# Patient Record
Sex: Female | Born: 1960
Health system: Southern US, Community
[De-identification: ages and names within clinical notes are randomized; demographics above are authoritative.]

## PROBLEM LIST (undated history)

## (undated) DIAGNOSIS — F419 Anxiety disorder, unspecified: Secondary | ICD-10-CM

## (undated) DIAGNOSIS — E785 Hyperlipidemia, unspecified: Secondary | ICD-10-CM

## (undated) DIAGNOSIS — K219 Gastro-esophageal reflux disease without esophagitis: Secondary | ICD-10-CM

## (undated) DIAGNOSIS — T7840XA Allergy, unspecified, initial encounter: Secondary | ICD-10-CM

## (undated) DIAGNOSIS — M1990 Unspecified osteoarthritis, unspecified site: Secondary | ICD-10-CM

## (undated) DIAGNOSIS — Z8619 Personal history of other infectious and parasitic diseases: Secondary | ICD-10-CM

## (undated) HISTORY — DX: Personal history of other infectious and parasitic diseases: Z86.19

## (undated) HISTORY — DX: Allergy, unspecified, initial encounter: T78.40XA

## (undated) HISTORY — DX: Anxiety disorder, unspecified: F41.9

## (undated) HISTORY — DX: Gastro-esophageal reflux disease without esophagitis: K21.9

## (undated) HISTORY — DX: Unspecified osteoarthritis, unspecified site: M19.90

## (undated) HISTORY — DX: Hyperlipidemia, unspecified: E78.5

---

## 2004-08-24 ENCOUNTER — Ambulatory Visit: Payer: Self-pay | Admitting: Unknown Physician Specialty

## 2011-02-13 ENCOUNTER — Ambulatory Visit: Payer: Self-pay | Admitting: Unknown Physician Specialty

## 2011-03-14 ENCOUNTER — Ambulatory Visit: Payer: Self-pay | Admitting: Unknown Physician Specialty

## 2011-07-09 LAB — HM MAMMOGRAPHY: HM Mammogram: NORMAL

## 2011-07-09 LAB — HM PAP SMEAR: HM Pap smear: NORMAL

## 2014-02-15 ENCOUNTER — Telehealth: Payer: Self-pay | Admitting: Internal Medicine

## 2014-02-15 NOTE — Telephone Encounter (Signed)
Staff msg sent to Dr. Derrel Nip for confirmation accepting as new pt.

## 2014-06-16 LAB — BASIC METABOLIC PANEL
CREATININE: 0.9 mg/dL (ref 0.5–1.1)
GLUCOSE: 82 mg/dL

## 2014-06-16 LAB — HEMOGLOBIN A1C: Hgb A1c MFr Bld: 5.8 % (ref 4.0–6.0)

## 2014-06-16 LAB — LIPID PANEL
CHOLESTEROL: 209 mg/dL — AB (ref 0–200)
HDL: 62 mg/dL (ref 35–70)
LDL Cholesterol: 126 mg/dL
TRIGLYCERIDES: 104 mg/dL (ref 40–160)

## 2014-07-08 ENCOUNTER — Ambulatory Visit (INDEPENDENT_AMBULATORY_CARE_PROVIDER_SITE_OTHER): Payer: 59 | Admitting: Internal Medicine

## 2014-07-08 ENCOUNTER — Encounter: Payer: Self-pay | Admitting: Internal Medicine

## 2014-07-08 VITALS — BP 112/70 | HR 66 | Temp 98.5°F | Ht 62.75 in | Wt 182.5 lb

## 2014-07-08 DIAGNOSIS — N6002 Solitary cyst of left breast: Secondary | ICD-10-CM | POA: Insufficient documentation

## 2014-07-08 DIAGNOSIS — N951 Menopausal and female climacteric states: Secondary | ICD-10-CM

## 2014-07-08 DIAGNOSIS — M542 Cervicalgia: Secondary | ICD-10-CM

## 2014-07-08 DIAGNOSIS — E559 Vitamin D deficiency, unspecified: Secondary | ICD-10-CM

## 2014-07-08 DIAGNOSIS — N8111 Cystocele, midline: Secondary | ICD-10-CM

## 2014-07-08 DIAGNOSIS — Z1211 Encounter for screening for malignant neoplasm of colon: Secondary | ICD-10-CM | POA: Insufficient documentation

## 2014-07-08 DIAGNOSIS — IMO0002 Reserved for concepts with insufficient information to code with codable children: Secondary | ICD-10-CM

## 2014-07-08 DIAGNOSIS — E663 Overweight: Secondary | ICD-10-CM

## 2014-07-08 DIAGNOSIS — E669 Obesity, unspecified: Secondary | ICD-10-CM | POA: Insufficient documentation

## 2014-07-08 DIAGNOSIS — Z1239 Encounter for other screening for malignant neoplasm of breast: Secondary | ICD-10-CM

## 2014-07-08 LAB — HEPATIC FUNCTION PANEL
ALK PHOS: 80 U/L (ref 25–125)
ALT: 72 U/L — AB (ref 7–35)
AST: 42 U/L — AB (ref 13–35)
Bilirubin, Total: 0.3 mg/dL

## 2014-07-08 LAB — CBC AND DIFFERENTIAL
HCT: 42 % (ref 36–46)
HEMOGLOBIN: 13.9 g/dL (ref 12.0–16.0)
Neutrophils Absolute: 3 /uL
PLATELETS: 315 10*3/uL (ref 150–399)
WBC: 5.6 10^3/mL

## 2014-07-08 LAB — LIPID PANEL
CHOLESTEROL: 236 mg/dL — AB (ref 0–200)
HDL: 77 mg/dL — AB (ref 35–70)
LDL Cholesterol: 146 mg/dL
TRIGLYCERIDES: 65 mg/dL (ref 40–160)

## 2014-07-08 LAB — BASIC METABOLIC PANEL
BUN: 12 mg/dL (ref 4–21)
Creatinine: 0.9 mg/dL (ref 0.5–1.1)
Glucose: 90 mg/dL
Potassium: 4.3 mmol/L (ref 3.4–5.3)
SODIUM: 140 mmol/L (ref 137–147)

## 2014-07-08 LAB — HEMOGLOBIN A1C: Hgb A1c MFr Bld: 5.8 % (ref 4.0–6.0)

## 2014-07-08 LAB — TSH: TSH: 4.46 u[IU]/mL (ref 0.41–5.90)

## 2014-07-08 NOTE — Assessment & Plan Note (Addendum)
I have addressed  BMI and recommended a low glycemic index diet utilizing smaller more frequent meals to increase metabolism.  I have also recommended that patient increased her exercise routine to 5 days per week of 30 minutes of aerobic exercise . Screening for lipid disorders, thyroid and diabetes to be done today.   Her Goal is 165 lbs to lower BMI  To < 30, and 140 for BMI 25

## 2014-07-08 NOTE — Patient Instructions (Signed)
This is  my version of a  "Low GI"  Diet:  It will still lower your blood sugars and allow you to lose 4 to 8  lbs  per month if you follow it carefully.  Your goal with exercise is a minimum of 30 minutes of aerobic exercise 5 days per week (Walking does not count once it becomes easy!)      All of the foods can be found at grocery stores and in bulk at [employer].  The Atkins protein bars and shakes are available in more varieties at Target, [employer] and [place].     7 AM Breakfast:  Choose from the following:  Low carbohydrate Protein  Shakes (I recommend only the EAS AdvantEdge "Carb Control" shakes  Or the low carb shakes by Atkins.    2.5 carbs)   Arnold's "Sandwhich Thin"toasted  w/ peanut butter (no jelly: about 20 net carbs  "Bagel Thin" with cream cheese and salmon: about 20 carbs   a scrambled egg/bacon/cheese burrito made with Mission's "carb balance" whole wheat tortilla  (about 10 net carbs )  A slice of home made fritatta (egg based dish without a crust:  google it)    Avoid cereal and bananas, oatmeal and cream of wheat and grits. They are loaded with carbohydrates!   10 AM: high protein snack  Protein bar by Atkins (the snack size, under 200 cal, usually < 6 net carbs).    A stick of cheese:  Around 1 carb,  100 cal     Dannon Light n Fit [place] Yogurt  (80 cal, 8 carbs)  Other so called "protein bars" and Greek yogurts tend to be loaded with carbohydrates.  Remember, in food advertising, the word "energy" is synonymous for " carbohydrate."  Lunch:   A Sandwich using the bread choices listed, Can use any  Eggs,  lunchmeat, grilled meat or canned tuna), avocado, regular mayo/mustard  and cheese.  A Salad using blue cheese, ranch,  Goddess or vinagrette,  No croutons or "confetti" and no "candied nuts" but regular nuts OK.   No pretzels or chips.  Pickles and miniature sweet peppers are a good low carb alternative that provide a "crunch"  The bread is the only source  of carbohydrate in a sandwich and  can be decreased by trying some of these alternatives to traditional loaf bread  Joseph's makes a pita bread and a flat bread that are 50 cal and 4 net carbs available at [place].  This can be toasted to use with hummous as well  Toufayan makes a low carb flatbread that's 100 cal and 9 net carbs available at [employer] and [employer]J's makes 2 sizes of  Low carb whole wheat tortilla  (The large one is 210 cal and 6 net carbs) Avoid "Low fat dressings, as well as Barry Brunner and Morganza dressings They are loaded with sugar!   3 PM/ Mid day  Snack:  Consider  1 ounce of  almonds, walnuts, pistachios, pecans, peanuts,  Macadamia nuts or a nut medley.  Avoid "granola"; the dried cranberries and raisins are loaded with carbohydrates. Mixed nuts as long as there are no raisins,  cranberries or dried fruit.    Try the prosciutto/mozzarella cheese sticks by Fiorruci  In deli /backery section   High protein   To avoid overindulging in snacks: Try drinking a glass of unsweeted almond/coconut milk  Or a cup of coffee with your Atkins chocolate  bar t o keep you from having 3!!!        6 PM  Dinner:     Meat/fowl/fish with a green salad, and either broccoli, cauliflower, green beans, spinach, brussel sprouts or  Lima beans. DO NOT BREAD THE PROTEIN!!      There is a low carb pasta by Dreamfield's that is acceptable and tastes great: only 5 digestible carbs/serving.( All grocery stores but BJs carry it )  Try Hurley Cisco Angelo's chicken piccata or chicken or eggplant parm over low carb pasta.(Lowes and BJs)   Marjory Lies Sanchez's "Carnitas" (pulled pork, no sauce,  0 carbs) or his beef pot roast to make a dinner burrito (at BJ's)  Pesto over low carb pasta (bj's sells a good quality pesto in the center refrigerated section of the deli   Try satueeing  Cheral Marker with mushroooms  Whole wheat pasta is still full of digestible carbs and  Not as low in glycemic index as  Dreamfield's.   Brown rice is still rice,  So skip the rice and noodles if you eat [place] or [place] (or at least limit to 1/2 cup)  9 PM snack :   Breyer's "low carb" fudgsicle or  ice cream bar (Carb Smart line), or  Weight Watcher's ice cream bar , or another "no sugar added" ice cream;  a serving of fresh berries/cherries with whipped cream   Cheese or DANNON'S LlGHT N FIT GREEK YOGURT or the Oikos greek yogurt   8 ounces of [place] unsweetened almond/cococunut milk    Avoid bananas, pineapple, grapes  and watermelon on a regular basis because they are high in sugar.  THINK OF THEM AS DESSERT  Remember that snack Substitutions should be less than 10 NET carbs per serving and meals < 20 carbs. Remember to subtract fiber grams to get the "net carbs."

## 2014-07-08 NOTE — Progress Notes (Signed)
Patient ID: Marie Howard, female   DOB: 1961-11-04, 53 y.o.   MRN: [id number]  Patient Active Problem List   Diagnosis Date Noted  . Unspecified vitamin D deficiency 07/08/2014  . Special screening for malignant neoplasms, colon 07/08/2014  . Obesity 07/08/2014  . Screening for breast cancer 07/08/2014    Subjective:  CC:   Chief Complaint  Patient presents with  . Establish Care    HPI:   Marie Howard a 53 y.o. female who presents as a New patient who unfortuantely arrived 15 min late,  And then proceeed to take 20 minutes to check in   Cc: recurrent tenderness of node under right ear for the past month,  Intermittent , seems to improve with massage of area.  No history of ear or sinus infections or scalp infections.   2) Has occasional vaginal discomfort  Sometimes feels a bulge . Had 2 vaginal deliveries,  Menopause at age 78  .  Sex life is nil,  Marriage is good . Some discomfort during sex. Has vaginal irritation.  Feels like a scrape. Might have seen blood when she wiped. Last UTI was over a year ago  Had an endometrial biopsy in 2006 which was normal, PAP smears have been normal since gthen.   3) Obesity;  Has had difficulty losing weigh.  Exercises regularly at local gym. tNeeds to appeal  [facility]'s  obesity tax with a letter from me   Past Medical History  Diagnosis Date  . Arthritis   . GERD (gastroesophageal reflux disease)   . Hyperlipidemia   . History of chicken pox     Allergies  Allergen Reactions  . Macrobid [Nitrofurantoin Monohyd Macro] Swelling   History reviewed. No pertinent past surgical history.  History   Social History  . Marital Status: Married    Spouse Name: N/A    Number of Children: N/A  . Years of Education: N/A   Occupational History  . Not on file.   Social History Main Topics  . Smoking status: Never Smoker   . Smokeless tobacco: Not on file  . Alcohol Use: 2.4 oz/week    4 Glasses of wine per week  . Drug Use: No  .  Sexual Activity: Yes   Other Topics Concern  . Not on file   Social History Narrative  . No narrative on file   Family History  Problem Relation Age of Onset  . Arthritis Mother   . Hyperlipidemia Mother   . Hypertension Mother   . Arthritis Father   . Hyperlipidemia Father   . Heart disease Father 13    CAD s/p 2 vessel CABG   . Hypertension Father   . Heart disease Paternal Grandmother   . Hyperlipidemia Paternal Grandmother   . Stroke Paternal Grandmother   . Cancer Paternal Grandfather     colon   Review of Systems:   The rest of the review of systems was negative except those addressed in the HPI.      Objective:  BP 112/70  Pulse 66  Temp(Src) 98.5 F (36.9 C) (Oral)  Ht 5' 2.75" (1.594 m)  Wt 182 lb 8 oz (82.781 kg)  BMI 32.58 kg/m2  SpO2 98%  General appearance: alert, cooperative and appears stated age Ears: normal TM's and external ear canals both ears Throat: lips, mucosa, and tongue normal; teeth and gums normal Neck: no adenopathy, no carotid bruit, supple, symmetrical, trachea midline and thyroid not enlarged, symmetric, no tenderness/mass/nodules Back: symmetric, no curvature.  ROM normal. No CVA tenderness. Lungs: clear to auscultation bilaterally Heart: regular rate and rhythm, S1, S2 normal, no murmur, click, rub or gallop Abdomen: soft, non-tender; bowel sounds normal; no masses,  no organomegaly Pulses: 2+ and symmetric Skin: Skin color, texture, turgor normal. No rashes or lesions Lymph nodes: Cervical, supraclavicular, and axillary nodes normal.  Assessment and Plan:  Obesity I have addressed  BMI and recommended a low glycemic index diet utilizing smaller more frequent meals to increase metabolism.  I have also recommended that patient increased her exercise routine to 5 days per week of 30 minutes of aerobic exercise . Screening for lipid disorders, thyroid and diabetes to be done today.   Her Goal is 165 lbs to lower BMI  To < 30,  and 140 for BMI 25   Special screening for malignant neoplasms, colon Recommended colonoscopy given FH of colon CA   Screening for breast cancer She is due for mammogram.  Unspecified vitamin D deficiency Rechecking level.  Discussed the current controversies surrounding the risks and benefits of calcium supplementation.  Encouraged her to increase dietary calcium through natural foods including almond/coconut milk  Neck pain on right side No evidence of LAD on exam today and HEENT exam was normal.  Suggested that her right sided neck pain was from  SCM muscle sprain ,. Will repeat eval in one month at annual exam.   Cystocele Suspected by histoyr.  Will return for pelvic exam in one  month  Vaginal dryness, menopausal Suspected by history .  Will return for pelvic exam in one month   A total of 60 minutes was spent with patient more than half of which was spent in counseling patient on the above mentioned issues , reviewing and explaining recent labs and imaging studies done, and coordination of care.   Updated Medication List Outpatient Encounter Prescriptions as of 07/08/2014  Medication Sig  . DHA-EPA-Vitamin E (OMEGA-3 COMPLEX PO) Take 2 capsules by mouth daily.  Marland Kitchen OVER THE COUNTER MEDICATION 2 capsules daily. Micro plex food nutrient  . OVER THE COUNTER MEDICATION 1 capsule daily. Bone nutrient  . OVER THE COUNTER MEDICATION 2 capsules daily. Cellular vitality complex

## 2014-07-08 NOTE — Progress Notes (Signed)
Pre visit review using our clinic review tool, if applicable. No additional management support is needed unless otherwise documented below in the visit note. 

## 2014-07-10 ENCOUNTER — Encounter: Payer: Self-pay | Admitting: Internal Medicine

## 2014-07-10 DIAGNOSIS — N951 Menopausal and female climacteric states: Secondary | ICD-10-CM | POA: Insufficient documentation

## 2014-07-10 DIAGNOSIS — IMO0002 Reserved for concepts with insufficient information to code with codable children: Secondary | ICD-10-CM | POA: Insufficient documentation

## 2014-07-10 DIAGNOSIS — M542 Cervicalgia: Secondary | ICD-10-CM | POA: Insufficient documentation

## 2014-07-10 NOTE — Assessment & Plan Note (Signed)
Suspected by history .  Will return for pelvic exam in one month

## 2014-07-10 NOTE — Assessment & Plan Note (Signed)
Suspected by histoyr.  Will return for pelvic exam in one  month

## 2014-07-10 NOTE — Assessment & Plan Note (Signed)
Recommended colonoscopy given FH of colon CA

## 2014-07-10 NOTE — Assessment & Plan Note (Signed)
She is due for mammogram.

## 2014-07-10 NOTE — Assessment & Plan Note (Signed)
No evidence of LAD on exam today and HEENT exam was normal.  Suggested that her right sided neck pain was from  SCM muscle sprain ,. Will repeat eval in one month at annual exam.

## 2014-07-10 NOTE — Assessment & Plan Note (Signed)
Rechecking level.  Discussed the current controversies surrounding the risks and benefits of calcium supplementation.  Encouraged her to increase dietary calcium through natural foods including almond/coconut milk

## 2014-07-11 ENCOUNTER — Telehealth: Payer: Self-pay | Admitting: Internal Medicine

## 2014-07-15 ENCOUNTER — Telehealth: Payer: Self-pay | Admitting: Internal Medicine

## 2014-07-15 NOTE — Telephone Encounter (Signed)
Your vitamin D is low, which can increase your risk of weak bones and fractures and interfere with your body's ability to absorb the calcium in your diet.   I am calling in a megadose of Vit D to take once weekly for a total of 3 months,  Then after you finish the weekly supplement, you should start taking an OTC  Vit D3 supplement 1000 units daily.    Your liver enzyme is a little elevated, which can be a sign of inflammation of the liver due to various reasons (which include viral hepatitis,  Recent strenous workout. Statin intolerance,  autoimmune hepatitis and fatty liver ).  I would like you to return in a month for a lab draw to run the liver test again along with some additional tests to rule out the other causes.   You do not need to be fasting, but should call the office for a lab appt.   Your cholesterol is borderline ,  You do not need medications.  We will repeat in 6 month s

## 2014-07-16 ENCOUNTER — Telehealth: Payer: Self-pay | Admitting: Internal Medicine

## 2014-07-16 DIAGNOSIS — R748 Abnormal levels of other serum enzymes: Secondary | ICD-10-CM

## 2014-07-16 MED ORDER — ERGOCALCIFEROL 1.25 MG (50000 UT) PO CAPS: 50000.0000 [IU] | ORAL_CAPSULE | ORAL | Status: DC

## 2014-07-16 NOTE — Telephone Encounter (Signed)
Called and advised patient of results, verbalized understanding. Pt has labs done at [place], would like to pick up a lab form and her "appeals paperwork" at the same time. Would like a call when those are ready. Advised pt I would check with Juliann Pulse on the status of that.   Vitamin D Rx needs sent to the pharmacy, please?

## 2014-07-16 NOTE — Telephone Encounter (Signed)
Patient stated she gave form to front desk explained to patient that should have been given to me, patient stated was fine she will fax another copy on Monday. Please send back so I can follow up Monday. FYI

## 2014-07-16 NOTE — Telephone Encounter (Signed)
And please ask front desk to designate me as PCP ,  She is not coming up as my patient so I have to find her date of brith every time I want to pull her up thanks

## 2014-07-16 NOTE — Telephone Encounter (Signed)
Did she drop off the appeals letter? I do not have it .  Vit d sent

## 2014-07-22 ENCOUNTER — Encounter: Payer: Self-pay | Admitting: Internal Medicine

## 2014-08-16 LAB — HEPATIC FUNCTION PANEL
ALT: 31 U/L (ref 7–35)
AST: 24 U/L (ref 13–35)
BILIRUBIN DIRECT: 0.1 mg/dL (ref 0.01–0.4)
BILIRUBIN, TOTAL: 0.4 mg/dL

## 2014-08-17 ENCOUNTER — Telehealth: Payer: Self-pay | Admitting: Internal Medicine

## 2014-08-20 ENCOUNTER — Telehealth: Payer: Self-pay | Admitting: Internal Medicine

## 2014-08-20 DIAGNOSIS — R748 Abnormal levels of other serum enzymes: Secondary | ICD-10-CM

## 2014-08-20 DIAGNOSIS — K760 Fatty (change of) liver, not elsewhere classified: Secondary | ICD-10-CM | POA: Insufficient documentation

## 2014-08-20 NOTE — Telephone Encounter (Signed)
Sent mychart message with results

## 2014-08-20 NOTE — Assessment & Plan Note (Signed)
Repeat hepatic panel and screen for autoimmune forms of liver diease was negative. No further workup is indicated  at this time

## 2014-08-20 NOTE — Telephone Encounter (Signed)
Repeat hepatic panel and screen for autoimmune forms of liver diease was negative. No further workup is indicated  at this time

## 2014-08-23 ENCOUNTER — Encounter: Payer: Self-pay | Admitting: Internal Medicine

## 2014-08-23 ENCOUNTER — Other Ambulatory Visit: Payer: Self-pay | Admitting: Internal Medicine

## 2014-08-23 ENCOUNTER — Ambulatory Visit (INDEPENDENT_AMBULATORY_CARE_PROVIDER_SITE_OTHER): Payer: 59 | Admitting: Internal Medicine

## 2014-08-23 VITALS — BP 118/80 | HR 76 | Temp 97.9°F | Resp 16 | Ht 63.0 in | Wt 182.2 lb

## 2014-08-23 DIAGNOSIS — Z1211 Encounter for screening for malignant neoplasm of colon: Secondary | ICD-10-CM

## 2014-08-23 DIAGNOSIS — N951 Menopausal and female climacteric states: Secondary | ICD-10-CM

## 2014-08-23 DIAGNOSIS — Z23 Encounter for immunization: Secondary | ICD-10-CM

## 2014-08-23 DIAGNOSIS — Z0000 Encounter for general adult medical examination without abnormal findings: Secondary | ICD-10-CM

## 2014-08-23 DIAGNOSIS — E669 Obesity, unspecified: Secondary | ICD-10-CM

## 2014-08-23 DIAGNOSIS — R748 Abnormal levels of other serum enzymes: Secondary | ICD-10-CM

## 2014-08-23 NOTE — Assessment & Plan Note (Signed)
I have addressed  BMI and recommended a low glycemic index diet utilizing smaller more frequent meals to increase metabolism.  I have also recommended that patient increased her exercise routine to 5 days per week of 30 minutes of aerobic exercise . Screening for lipid disorders, thyroid and diabetes to be done today.   Her Goal is 165 lbs to lower BMI  To < 30, and 140 for BMI 25

## 2014-08-23 NOTE — Assessment & Plan Note (Signed)
Likely  From muscle breakdown, sinc eit resolved with repeat testing and temporary suspension of weight lifting and screening for other causes was negative

## 2014-08-23 NOTE — Patient Instructions (Addendum)
You had your annual  wellness exam today.  We will repeat your PAP smear in 2018    You received the TDaP vaccine today.  If you develop a sore arm , use tylenol and ibuprofen for a few days    Your repeat liver enzymes were normal,  The elevation was likely due to  the muscle breakdown that occurs with weightlifting t and vigorous exercise.  No further workup is required

## 2014-08-23 NOTE — Progress Notes (Signed)
Pre-visit discussion using our clinic review tool. No additional management support is needed unless otherwise documented below in the visit note.  

## 2014-08-25 DIAGNOSIS — Z1211 Encounter for screening for malignant neoplasm of colon: Secondary | ICD-10-CM | POA: Insufficient documentation

## 2014-08-25 DIAGNOSIS — Z0000 Encounter for general adult medical examination without abnormal findings: Secondary | ICD-10-CM | POA: Insufficient documentation

## 2014-08-25 NOTE — Progress Notes (Signed)
Patient ID: Marie Howard, female   DOB: 1961/09/22, 53 y.o.   MRN: [id number]  Subjective:     Marie Howard is a 53 y.o. female and is here for a comprehensive physical exam. The patient reports no problems.  History   Social History  . Marital Status: Married    Spouse Name: N/A    Number of Children: N/A  . Years of Education: N/A   Occupational History  . Not on file.   Social History Main Topics  . Smoking status: Never Smoker   . Smokeless tobacco: Not on file  . Alcohol Use: 2.4 oz/week    4 Glasses of wine per week  . Drug Use: No  . Sexual Activity: Yes   Other Topics Concern  . Not on file   Social History Narrative  . No narrative on file   Health Maintenance  Topic Date Due  . Colonoscopy  01/20/2011  . Mammogram  07/08/2013  . Influenza Vaccine  06/19/2014  . Pap Smear  07/08/2014  . Tetanus/tdap  08/23/2024    The following portions of the patient's history were reviewed and updated as appropriate: allergies, current medications, past family history, past medical history, past social history, past surgical history and problem list.  Review of Systems A comprehensive review of systems was negative.   Objective:   BP 118/80  Pulse 76  Temp(Src) 97.9 F (36.6 C) (Oral)  Resp 16  Ht 5\' 3"  (1.6 m)  Wt 182 lb 4 oz (82.668 kg)  BMI 32.29 kg/m2  SpO2 98%  General Appearance:    Alert, cooperative, no distress, appears stated age  Head:    Normocephalic, without obvious abnormality, atraumatic  Eyes:    PERRL, conjunctiva/corneas clear, EOM's intact, fundi    benign, both eyes  Ears:    Normal TM's and external ear canals, both ears  Nose:   Nares normal, septum midline, mucosa normal, no drainage    or sinus tenderness  Throat:   Lips, mucosa, and tongue normal; teeth and gums normal  Neck:   Supple, symmetrical, trachea midline, no adenopathy;    thyroid:  no enlargement/tenderness/nodules; no carotid   bruit or JVD  Back:     Symmetric, no  curvature, ROM normal, no CVA tenderness  Lungs:     Clear to auscultation bilaterally, respirations unlabored  Chest Wall:    No tenderness or deformity   Heart:    Regular rate and rhythm, S1 and S2 normal, no murmur, rub   or gallop  Breast Exam:    No tenderness, masses, or nipple abnormality  Abdomen:     Soft, non-tender, bowel sounds active all four quadrants,    no masses, no organomegaly  Genitalia:    Pelvic: cervix normal in appearance, external genitalia normal, no adnexal masses or tenderness, no cervical motion tenderness, rectovaginal septum normal, uterus normal size, shape, and consistency and vagina normal without discharge  Extremities:   Extremities normal, atraumatic, no cyanosis or edema  Pulses:   2+ and symmetric all extremities  Skin:   Skin color, texture, turgor normal, no rashes or lesions  Lymph nodes:   Cervical, supraclavicular, and axillary nodes normal  Neurologic:   CNII-XII intact, normal strength, sensation and reflexes    throughout    Assessment and Plan:   Elevated liver enzymes Likely  From muscle breakdown, sinc eit resolved with repeat testing and temporary suspension of weight lifting and screening for other causes was negative   Obesity I  have addressed  BMI and recommended a low glycemic index diet utilizing smaller more frequent meals to increase metabolism.  I have also recommended that patient increased her exercise routine to 5 days per week of 30 minutes of aerobic exercise . Screening for lipid disorders, thyroid and diabetes to be done today.   Her Goal is 165 lbs to lower BMI  To < 30, and 140 for BMI 25     Vaginal dryness, menopausal Discussed  Available  hormonal and non hormonal options. She prefers to avoid estrogen at this time;   Visit for preventive health examination Annual comprehensive exam was done including breast,pelvic and PAP smear. All screenings have been addressed and a printed health maintenance schedule was  given to patient.     Updated Medication List Outpatient Encounter Prescriptions as of 08/23/2014  Medication Sig  . DHA-EPA-Vitamin E (OMEGA-3 COMPLEX PO) Take 2 capsules by mouth daily.  . ergocalciferol (DRISDOL) 50000 UNITS capsule Take 1 capsule (50,000 Units total) by mouth once a week.  Marland Kitchen OVER THE COUNTER MEDICATION 2 capsules daily. Micro plex food nutrient  . OVER THE COUNTER MEDICATION 1 capsule daily. Bone nutrient  . OVER THE COUNTER MEDICATION 2 capsules daily. Cellular vitality complex

## 2014-08-25 NOTE — Assessment & Plan Note (Addendum)
Discussed  Available  hormonal and non hormonal options. She prefers to avoid estrogen at this time;

## 2014-08-25 NOTE — Assessment & Plan Note (Signed)
Annual comprehensive exam was done including breast, pelvic and PAP smear. All screenings have been addressed and a printed health maintenance schedule was given to patient.   

## 2014-08-26 ENCOUNTER — Encounter: Payer: Self-pay | Admitting: Internal Medicine

## 2014-08-27 LAB — PAP LB, HPV-H+LR
HPV DNA HIGH RISK: NEGATIVE
HPV DNA LOW RISK: NEGATIVE
PAP Smear Comment: 0

## 2014-08-29 ENCOUNTER — Encounter: Payer: Self-pay | Admitting: Internal Medicine

## 2014-08-31 ENCOUNTER — Telehealth: Payer: Self-pay | Admitting: Internal Medicine

## 2014-09-06 ENCOUNTER — Encounter: Payer: Self-pay | Admitting: Internal Medicine

## 2014-09-13 ENCOUNTER — Ambulatory Visit: Payer: Self-pay | Admitting: Internal Medicine

## 2014-09-13 LAB — HM MAMMOGRAPHY: HM Mammogram: NEGATIVE

## 2014-09-13 NOTE — Telephone Encounter (Signed)
Mailed unread message to pt  

## 2014-09-15 ENCOUNTER — Encounter: Payer: Self-pay | Admitting: *Deleted

## 2014-09-30 ENCOUNTER — Encounter: Payer: Self-pay | Admitting: Internal Medicine

## 2016-03-21 ENCOUNTER — Telehealth: Payer: Self-pay | Admitting: Internal Medicine

## 2016-03-21 DIAGNOSIS — R748 Abnormal levels of other serum enzymes: Secondary | ICD-10-CM

## 2016-03-21 DIAGNOSIS — Z0000 Encounter for general adult medical examination without abnormal findings: Secondary | ICD-10-CM

## 2016-03-21 DIAGNOSIS — E669 Obesity, unspecified: Secondary | ICD-10-CM

## 2016-03-21 DIAGNOSIS — E559 Vitamin D deficiency, unspecified: Secondary | ICD-10-CM

## 2016-03-21 NOTE — Telephone Encounter (Signed)
Would Dr.Tullo be okay with patient having labs before appointment? If.so which labs would she like to have drawn?

## 2016-03-21 NOTE — Telephone Encounter (Signed)
Pt called wanting to get lab work before her Physical appt. Need orders please and thank you! Call pt @ [phone]. Thank you!

## 2016-03-23 NOTE — Telephone Encounter (Signed)
I put in future labs are they OK?

## 2016-04-30 ENCOUNTER — Encounter: Payer: Self-pay | Admitting: Internal Medicine

## 2016-06-25 ENCOUNTER — Encounter: Payer: Self-pay | Admitting: Internal Medicine

## 2016-07-19 ENCOUNTER — Encounter: Payer: Self-pay | Admitting: Internal Medicine

## 2016-07-21 ENCOUNTER — Other Ambulatory Visit: Payer: Self-pay | Admitting: Internal Medicine

## 2016-07-21 DIAGNOSIS — R739 Hyperglycemia, unspecified: Secondary | ICD-10-CM

## 2016-07-21 DIAGNOSIS — R300 Dysuria: Secondary | ICD-10-CM

## 2016-07-21 DIAGNOSIS — E785 Hyperlipidemia, unspecified: Secondary | ICD-10-CM

## 2016-07-21 DIAGNOSIS — R5383 Other fatigue: Secondary | ICD-10-CM

## 2016-07-21 DIAGNOSIS — E559 Vitamin D deficiency, unspecified: Secondary | ICD-10-CM

## 2016-07-21 DIAGNOSIS — Z113 Encounter for screening for infections with a predominantly sexual mode of transmission: Secondary | ICD-10-CM

## 2016-07-24 ENCOUNTER — Telehealth: Payer: Self-pay | Admitting: Internal Medicine

## 2016-07-24 NOTE — Telephone Encounter (Signed)
Pt needs lab orders sent over to [facility] [address].Marland Kitchen

## 2016-07-24 NOTE — Telephone Encounter (Signed)
Please call pt about going to [facility] for labs. She says they do not have the paper, read note in chart to pt. Phone number is [phone].

## 2016-07-24 NOTE — Telephone Encounter (Signed)
Labs are in the system to be done at [facility] already, thanks

## 2016-07-25 ENCOUNTER — Telehealth: Payer: Self-pay | Admitting: *Deleted

## 2016-07-25 DIAGNOSIS — R300 Dysuria: Secondary | ICD-10-CM

## 2016-07-25 DIAGNOSIS — R5383 Other fatigue: Secondary | ICD-10-CM

## 2016-07-25 DIAGNOSIS — E559 Vitamin D deficiency, unspecified: Secondary | ICD-10-CM

## 2016-07-25 DIAGNOSIS — R739 Hyperglycemia, unspecified: Secondary | ICD-10-CM

## 2016-07-25 DIAGNOSIS — E785 Hyperlipidemia, unspecified: Secondary | ICD-10-CM

## 2016-07-25 NOTE — Telephone Encounter (Signed)
Can you confirm that these are to be drawn at [facility], the patient called the draw center and they are not  Seeing them, thanks

## 2016-07-25 NOTE — Telephone Encounter (Signed)
Faythe Ghee, is the urine ordered there too?

## 2016-07-25 NOTE — Telephone Encounter (Signed)
Patient wanted to make sure that [facility] could see her Lab orders  Pt contact [phone]

## 2016-07-25 NOTE — Telephone Encounter (Signed)
Lab orders placed for [facility]

## 2016-07-25 NOTE — Telephone Encounter (Signed)
Dr. Derrel Nip ordered them to be drawn at [facility] so I don't know why they are not seeing them.

## 2016-07-25 NOTE — Telephone Encounter (Signed)
Yes, it looks like everything was ordered to be done there.

## 2016-07-25 NOTE — Telephone Encounter (Signed)
Labs have been reordered correctly.   Must be ordered in a telephone note (not orders only). And must be entered as "routine" and "lab collect"

## 2016-07-26 NOTE — Telephone Encounter (Signed)
This was fixed thanks

## 2016-07-27 LAB — TSH: TSH: 5.12 u[IU]/mL — ABNORMAL HIGH (ref 0.450–4.500)

## 2016-07-27 LAB — COMPREHENSIVE METABOLIC PANEL
A/G RATIO: 1.6 (ref 1.2–2.2)
ALT: 38 IU/L — ABNORMAL HIGH (ref 0–32)
AST: 27 IU/L (ref 0–40)
Albumin: 4.4 g/dL (ref 3.5–5.5)
Alkaline Phosphatase: 86 IU/L (ref 39–117)
BILIRUBIN TOTAL: 0.4 mg/dL (ref 0.0–1.2)
BUN/Creatinine Ratio: 13 (ref 9–23)
BUN: 12 mg/dL (ref 6–24)
CALCIUM: 9.8 mg/dL (ref 8.7–10.2)
CHLORIDE: 100 mmol/L (ref 96–106)
CO2: 24 mmol/L (ref 18–29)
Creatinine, Ser: 0.92 mg/dL (ref 0.57–1.00)
GFR, EST AFRICAN AMERICAN: 81 mL/min/{1.73_m2} (ref >59)
GFR, EST NON AFRICAN AMERICAN: 70 mL/min/{1.73_m2} (ref >59)
GLOBULIN, TOTAL: 2.7 g/dL (ref 1.5–4.5)
Glucose: 96 mg/dL (ref 65–99)
POTASSIUM: 4.3 mmol/L (ref 3.5–5.2)
SODIUM: 141 mmol/L (ref 134–144)
TOTAL PROTEIN: 7.1 g/dL (ref 6.0–8.5)

## 2016-07-27 LAB — CBC WITH DIFFERENTIAL/PLATELET
BASOS: 1 %
Basophils Absolute: 0 10*3/uL (ref 0.0–0.2)
EOS (ABSOLUTE): 0.1 10*3/uL (ref 0.0–0.4)
EOS: 2 %
HEMATOCRIT: 40.6 % (ref 34.0–46.6)
HEMOGLOBIN: 14.4 g/dL (ref 11.1–15.9)
IMMATURE GRANULOCYTES: 0 %
Immature Grans (Abs): 0 10*3/uL (ref 0.0–0.1)
LYMPHS ABS: 2 10*3/uL (ref 0.7–3.1)
Lymphs: 40 %
MCH: 32.3 pg (ref 26.6–33.0)
MCHC: 35.5 g/dL (ref 31.5–35.7)
MCV: 91 fL (ref 79–97)
Monocytes Absolute: 0.5 10*3/uL (ref 0.1–0.9)
Monocytes: 10 %
NEUTROS ABS: 2.4 10*3/uL (ref 1.4–7.0)
Neutrophils: 47 %
Platelets: 267 10*3/uL (ref 150–379)
RBC: 4.46 x10E6/uL (ref 3.77–5.28)
RDW: 13.7 % (ref 12.3–15.4)
WBC: 5.1 10*3/uL (ref 3.4–10.8)

## 2016-07-27 LAB — URINALYSIS, ROUTINE W REFLEX MICROSCOPIC
Bilirubin, UA: NEGATIVE
GLUCOSE, UA: NEGATIVE
KETONES UA: NEGATIVE
NITRITE UA: NEGATIVE
PROTEIN UA: NEGATIVE
RBC, UA: NEGATIVE
SPEC GRAV UA: 1.009 (ref 1.005–1.030)
UUROB: 0.2 mg/dL (ref 0.2–1.0)
pH, UA: 6.5 (ref 5.0–7.5)

## 2016-07-27 LAB — LIPID PANEL
CHOL/HDL RATIO: 3.8 ratio (ref 0.0–4.4)
Cholesterol, Total: 245 mg/dL — ABNORMAL HIGH (ref 100–199)
HDL: 65 mg/dL (ref >39)
LDL Calculated: 155 mg/dL — ABNORMAL HIGH (ref 0–99)
Triglycerides: 127 mg/dL (ref 0–149)
VLDL CHOLESTEROL CAL: 25 mg/dL (ref 5–40)

## 2016-07-27 LAB — VITAMIN D 25 HYDROXY (VIT D DEFICIENCY, FRACTURES): VIT D 25 HYDROXY: 13.7 ng/mL — AB (ref 30.0–100.0)

## 2016-07-27 LAB — MICROSCOPIC EXAMINATION
Bacteria, UA: NONE SEEN
Casts: NONE SEEN /lpf
RBC, UA: NONE SEEN /hpf (ref 0–?)

## 2016-07-27 LAB — HEPATITIS C ANTIBODY

## 2016-07-27 LAB — HEMOGLOBIN A1C
ESTIMATED AVERAGE GLUCOSE: 108 mg/dL
HEMOGLOBIN A1C: 5.4 % (ref 4.8–5.6)

## 2016-07-29 ENCOUNTER — Other Ambulatory Visit: Payer: Self-pay | Admitting: Internal Medicine

## 2016-07-29 ENCOUNTER — Encounter: Payer: Self-pay | Admitting: Internal Medicine

## 2016-07-29 MED ORDER — ERGOCALCIFEROL 1.25 MG (50000 UT) PO CAPS: 50000.0000 [IU] | ORAL_CAPSULE | ORAL | 2 refills | Status: DC

## 2016-07-30 ENCOUNTER — Ambulatory Visit (INDEPENDENT_AMBULATORY_CARE_PROVIDER_SITE_OTHER): Payer: BLUE CROSS/BLUE SHIELD | Admitting: Internal Medicine

## 2016-07-30 ENCOUNTER — Telehealth: Payer: Self-pay | Admitting: General Surgery

## 2016-07-30 ENCOUNTER — Encounter: Payer: Self-pay | Admitting: Internal Medicine

## 2016-07-30 VITALS — BP 118/72 | HR 72 | Temp 98.0°F | Resp 16 | Ht 62.5 in | Wt 195.2 lb

## 2016-07-30 DIAGNOSIS — E669 Obesity, unspecified: Secondary | ICD-10-CM

## 2016-07-30 DIAGNOSIS — E038 Other specified hypothyroidism: Secondary | ICD-10-CM

## 2016-07-30 DIAGNOSIS — Z1239 Encounter for other screening for malignant neoplasm of breast: Secondary | ICD-10-CM

## 2016-07-30 DIAGNOSIS — I208 Other forms of angina pectoris: Secondary | ICD-10-CM | POA: Diagnosis not present

## 2016-07-30 DIAGNOSIS — E034 Atrophy of thyroid (acquired): Secondary | ICD-10-CM

## 2016-07-30 DIAGNOSIS — E785 Hyperlipidemia, unspecified: Secondary | ICD-10-CM | POA: Diagnosis not present

## 2016-07-30 DIAGNOSIS — Z1211 Encounter for screening for malignant neoplasm of colon: Secondary | ICD-10-CM

## 2016-07-30 DIAGNOSIS — R740 Nonspecific elevation of levels of transaminase and lactic acid dehydrogenase [LDH]: Secondary | ICD-10-CM

## 2016-07-30 DIAGNOSIS — Z0000 Encounter for general adult medical examination without abnormal findings: Secondary | ICD-10-CM | POA: Diagnosis not present

## 2016-07-30 DIAGNOSIS — R7401 Elevation of levels of liver transaminase levels: Secondary | ICD-10-CM

## 2016-07-30 MED ORDER — LEVOTHYROXINE SODIUM 50 MCG PO TABS: 50.0000 ug | ORAL_TABLET | Freq: Every day | ORAL | 1 refills | Status: DC

## 2016-07-30 NOTE — Telephone Encounter (Signed)
07-30-16 l/m on c# for pt to ret call & schedule appt with Dr Bary Castilla for a screening colonoscopy.has seen dr Bary Castilla 05-18-97)? if had any colonoscopies in past. ref  dr tullo/mth

## 2016-07-30 NOTE — Progress Notes (Signed)
Patient ID: Marie Howard, female    DOB: [birth year]  Age: 55 y.o. MRN: [id number]  The patient is here for annual physical examination and management of other chronic and acute problems. Last seen Oct 2015 No prior colonoscopy Last PAP 2015 Last mammogram 2015 Hep c 2017 screened   The risk factors are reflected in the social history.  The roster of all physicians providing medical Howard to patient - is listed in the Snapshot section of the chart.  Home safety : The patient has smoke detectors in the home. They wear seatbelts.  There are no firearms at home. There is no violence in the home.   There is no risks for hepatitis, STDs or HIV. There is no   history of blood transfusion. They have no travel history to infectious disease endemic areas of the world.  The patient has seen their dentist in the last six month. They have seen their eye doctor in the last year.   .  They have deferred audiologic testing in the last year.  They do not  have excessive sun exposure. Discussed the need for sun protection: hats, long sleeves and use of sunscreen if there is significant sun exposure.   Diet: the importance of a healthy diet is discussed. They do have a healthy diet.  The benefits of regular aerobic exercise were discussed. She walks 4 times per week ,  20 minutes.   Depression screen: there are  some signs or vegative symptoms of depression- has had some deaths in the family ,  Worries a lot  anhedonia, sadness/tearfullness.  Stopped going to the gym until just recently and working with Marie Howard.  The following portions of the patient's history were reviewed and updated as appropriate: allergies, current medications, past family history, past medical history,  past surgical history, past social history  and problem list.  Visual acuity was not assessed per patient preference since she has regular follow up with her ophthalmologist. Hearing and body mass index were assessed and  reviewed.   During the course of the visit the patient was educated and counseled about appropriate screening and preventive services including : fall prevention , diabetes screening, nutrition counseling, colorectal cancer screening, and recommended immunizations.    CC: The primary encounter diagnosis was Colon cancer screening. Diagnoses of Angina of effort Marie Howard), Breast cancer screening, Hypothyroidism due to acquired atrophy of thyroid, Elevated ALT measurement, Hyperlipidemia, Obesity, and Visit for preventive health examination were also pertinent to this visit.  45 minutes was  spent with patient today addressing multiple concerns: wt gain,  nauea with exercise,  And underactive thyroid etc.  Has had dysplastic nevi removed in the past by Marie Howard Has hypopigmented /hyperpigmented areas under axilla ,  Needs to screen for autoimmune  Has occasional nausea when working out and when waking up.  FH of hyperlipidemia and CAD  .   [patient has untreated mild hyperlipidemia.  Menopausal.  Weight gain of 13 lbs BMI   35 .  Struggling with weight,  With exercising.  Hot flashes all ruining her libido. Diet reviewed exercise regimen reviewed.   History Marie Howard has a past medical history of Arthritis; GERD (gastroesophageal reflux disease); History of chicken pox; and Hyperlipidemia.   She has no past surgical history on file.   Her family history includes Arthritis in her father and mother; Cancer in her paternal grandfather; Heart disease in her paternal grandmother; Heart disease (age of onset: 5) in her father; Hyperlipidemia  in her father, mother, and paternal grandmother; Hypertension in her father and mother; Stroke in her paternal grandmother.She reports that she has never smoked. She has never used smokeless tobacco. She reports that she drinks about 2.4 oz of alcohol per week . She reports that she does not use drugs.  Outpatient Medications Prior to Visit  Medication Sig Dispense  Refill  . DHA-EPA-Vitamin E (OMEGA-3 COMPLEX PO) Take 2 capsules by mouth daily.    . ergocalciferol (DRISDOL) 50000 units capsule Take 1 capsule (50,000 Units total) by mouth once a week. (Patient not taking: Reported on 07/30/2016) 12 capsule 2  . OVER THE COUNTER MEDICATION 2 capsules daily. Micro plex food nutrient    . OVER THE COUNTER MEDICATION 1 capsule daily. Bone nutrient    . OVER THE COUNTER MEDICATION 2 capsules daily. Cellular vitality complex     No facility-administered medications prior to visit.     Review of Systems   Patient denies headache, fevers, malaise, unintentional weight loss, skin rash, eye pain, sinus congestion and sinus pain, sore throat, dysphagia,  hemoptysis , cough, dyspnea, wheezing, chest pain, palpitations, orthopnea, edema, abdominal pain, nausea, melena, diarrhea, constipation, flank pain, dysuria, hematuria, urinary  Frequency, nocturia, numbness, tingling, seizures,  Focal weakness, Loss of consciousness,  Tremor, insomnia, depression, anxiety, and suicidal ideation.      Objective:  BP 118/72 (BP Location: Right Arm, Patient Position: Sitting, Cuff Size: Large)   Pulse 72   Temp 98 F (36.7 C) (Oral)   Resp 16   Ht 5' 2.5" (1.588 m)   Wt 195 lb 3.2 oz (88.5 kg)   SpO2 95%   BMI 35.13 kg/m   Physical Exam   General appearance: alert, cooperative and appears stated age Head: Normocephalic, without obvious abnormality, atraumatic Eyes: conjunctivae/corneas clear. PERRL, EOM's intact. Fundi benign. Ears: normal TM's and external ear canals both ears Nose: Nares normal. Septum midline. Mucosa normal. No drainage or sinus tenderness. Throat: lips, mucosa, and tongue normal; teeth and gums normal Neck: no adenopathy, no carotid bruit, no JVD, supple, symmetrical, trachea midline and thyroid not enlarged, symmetric, no tenderness/mass/nodules Lungs: clear to auscultation bilaterally Breasts: normal appearance, no masses or tenderness Heart:  regular rate and rhythm, S1, S2 normal, no murmur, click, rub or gallop Abdomen: soft, non-tender; bowel sounds normal; no masses,  no organomegaly Extremities: extremities normal, atraumatic, no cyanosis or edema Pulses: 2+ and symmetric Skin: Skin color, texture, turgor normal. No rashes or lesions Neurologic: Alert and oriented X 3, normal strength and tone. Normal symmetric reflexes. Normal coordination and gait.     Assessment & Plan:   Problem List Items Addressed This Visit    Obesity    I have addressed  BMI and recommended wt loss of 10% of body weight over the next 6 months using a low fat, low starch, high protein  fruit/vegetable based Mediterranean diet and 30 minutes of aerobic exercise a minimum of 5 days per week.        Visit for preventive health examination    Annual comprehensive preventive exam was done as well as an evaluation and management of chronic conditions .  During the course of the visit the patient was educated and counseled about appropriate screening and preventive services including :  diabetes screening, lipid analysis with projected  10 year  risk for CAD , nutrition counseling, breast, cervical and colorectal cancer screening, and recommended immunizations.  Printed recommendations for health maintenance screenings was given  Angina of effort ([place])    Suspected by her reports of nausea brought on by exercise and early morning symptoms,  Referring to Cardiology for risk stratification      Relevant Orders   Ambulatory referral to Cardiology   Hypothyroidism due to acquired atrophy of thyroid    Given her weight gain,  Hyperlipidemia,  Fatigue, I recommended treatment.  Low dose levothyroxine prescribed.  Return in 6 weeks for TSh      Relevant Medications   levothyroxine (SYNTHROID) 50 MCG tablet   Other Relevant Orders   TSH    Other Visit Diagnoses    Colon cancer screening    -  Primary   Relevant Orders   Ambulatory referral to  General Surgery   Breast cancer screening       Relevant Orders   MM DIGITAL SCREENING BILATERAL   Elevated ALT measurement       Relevant Orders   Hepatic function panel   Hyperlipidemia       Relevant Orders   Lipid panel      I am having Ms. Zuniga start on levothyroxine. I am also having her maintain her OVER THE COUNTER MEDICATION, OVER THE COUNTER MEDICATION, DHA-EPA-Vitamin E (OMEGA-3 COMPLEX PO), OVER THE COUNTER MEDICATION, and ergocalciferol.  Meds ordered this encounter  Medications  . levothyroxine (SYNTHROID) 50 MCG tablet    Sig: Take 1 tablet (50 mcg total) by mouth daily before breakfast.    Dispense:  90 tablet    Refill:  1    There are no discontinued medications.  Follow-up: No Follow-up on file.   Crecencio Mc, MD

## 2016-07-30 NOTE — Patient Instructions (Addendum)
I am starting you on a very low dose of Synthroid (thyroid medication ) .  We will recheck your level along with fasting lipids and liver enzymes  in  6 weeks  Cardiology referral in process for stress testing  Mammogram and colonoscopy referral are also in process    This is  My  example of a  "Low GI"  Diet:  It will allow you to lose 4 to 8  lbs  per month if you follow it carefully.  Your goal with exercise is a minimum of 30 minutes of aerobic exercise 5 days per week (Walking does not count once it becomes easy!)    All of the foods can be found at grocery stores and in bulk at [employer].  The Atkins protein bars and shakes are available in more varieties at Target, [employer]Mart and [place].     7 AM Breakfast:  Choose from the following:  Low carbohydrate Protein  Shakes (I recommend the  Premier Protein chocolate shakes,  EAS AdvantEdge "Carb Control" shakes  Or the Atkins shakes all are under 3 net carbs)     a scrambled egg/bacon/cheese burrito made with [place]'s "carb balance" whole wheat tortilla  (about 10 net carbs )  Regulatory affairs officer (basically a quiche without the pastry crust) that is eaten cold and very convenient way to get your eggs.  8 carbs)  If you make your own protein shakes, avoid bananas and pineapple,  And use low carb greek yogurt or original /unsweetened almond or soy milk    Avoid cereal and bananas, oatmeal and cream of wheat and grits. They are loaded with carbohydrates!   10 AM: high protein snack:  Protein bar by Atkins (the snack size, under 200 cal, usually < 6 net carbs).    A stick of cheese:  Around 1 carb,  100 cal     Dannon Light n Fit [place] Yogurt  (80 cal, 8 carbs)  Other so called "protein bars" and Greek yogurts tend to be loaded with carbohydrates.  Remember, in food advertising, the word "energy" is synonymous for " carbohydrate."  Lunch:   A Sandwich using the bread choices listed, Can use any  Eggs,  lunchmeat,  grilled meat or canned tuna), avocado, regular mayo/mustard  and cheese.  A Salad using blue cheese, ranch,  Goddess or vinagrette,  Avoid taco shells, croutons or "confetti" and no "candied nuts" but regular nuts OK.   No pretzels, nabs  or chips.  Pickles and miniature sweet peppers are a good low carb alternative that provide a "crunch"  The bread is the only source of carbohydrate in a sandwich and  can be decreased by trying some of the attached alternatives to traditional loaf bread   Avoid "Low fat dressings, as well as [place] dressings They are loaded with sugar!   3 PM/ Mid day  Snack:  Consider  1 ounce of  almonds, walnuts, pistachios, pecans, peanuts,  Macadamia nuts or a nut medley.  Avoid "granola and granola bars "  Mixed nuts are ok in moderation as long as there are no raisins,  cranberries or dried fruit.   KIND bars are OK if you get the low glycemic index variety   Try the prosciutto/mozzarella cheese sticks by [employer]ruci  In deli /backery section   High protein      6 PM  Dinner:     Meat/fowl/fish with a green salad, and either  broccoli, cauliflower, green beans, spinach, brussel sprouts or  Lima beans. DO NOT BREAD THE PROTEIN!!      There is a low carb pasta by Dreamfield's that is acceptable and tastes great: only 5 digestible carbs/serving.( All grocery stores but BJs carry it ) Several ready made meals are available low carb:   Try Michel Angelo's chicken piccata or chicken or eggplant parm over low carb pasta.(Lowes and BJs)   Marjory Lies Sanchez's "Carnitas" (pulled pork, no sauce,  0 carbs) or his beef pot roast to make a dinner burrito (at BJ's)  Pesto over low carb pasta (bj's sells a good quality pesto in the center refrigerated section of the deli   Try satueeing  Cheral Marker with mushroooms as a good side   [employer] Giant makes a mashed cauliflower that tastes like mashed potatoes  Whole wheat pasta is still full of digestible carbs and  Not as low  in glycemic index as Dreamfield's.   Brown rice is still rice,  So skip the rice and noodles if you eat [place] or [place] (or at least limit to 1/2 cup)  9 PM snack :   Breyer's "low carb" fudgsicle or  ice cream bar (Carb Smart line), or  Weight Watcher's ice cream bar , or another "no sugar added" ice cream;  a serving of fresh berries/cherries with whipped cream   Cheese or DANNON'S LlGHT N FIT GREEK YOGURT  8 ounces of [place] unsweetened almond/cococunut milk    Treat yourself to a parfait made with whipped cream blueberiies, walnuts and vanilla greek yogurt  Avoid bananas, pineapple, grapes  and watermelon on a regular basis because they are high in sugar.  THINK OF THEM AS DESSERT  Remember that snack Substitutions should be less than 10 NET carbs per serving and meals < 20 carbs. Remember to subtract fiber grams to get the "net carbs."        The  diet I discussed with you today is the 10 day Green Smoothie Cleansing /Detox Diet by Linden Dolin . available on [place] for around $10.  It does require a blender, (Vita Mix, a electric juicer,  Or a Nutribullet Rx).  This is not a low carb or a weight loss diet,  It is fundamentally a "cleansing" low fat diet that eliminates sugar, gluten, caffeine, alcohol and dairy for 10 days .  What you add back after the initial ten days is entirely up to  you!  You can expect to lose 5 to 10 lbs depending on how strict you are.   I found that  drinking 2 smoothies or juices  daily and keeping one chewable meal (but keep it simple, like baked fish and salad, rice or bok choy) kept me satisfied and kept me from straying  .  You snack primarily on fresh  fruit, egg whites and judicious quantities of nuts.  You can add a  vegetable based protein powder  to any smoothie made with almond milk (nothing with whey , since whey is dairy)  WalMart has a few but  the [employer] Shoppe has the greatest  selection .  Using frozen fruits is much more convenient  and cost effective. You can even find plenty of organic fruit in the frozen fruit section of BJS's.  Just thaw what you need for the following day the night before in the refrigerator (to avoid jamming up your machine)   The organic vegan protein powder I tried  is called Wendee Beavers" and  I found it at [facility] .  It is sugar free. Tastes like crap.  My advice:  Sarina Ser your protein  (eat an egg or two in the am with your smoothie or add soy yogurt for protein ) ,  Don't ruin the taste of your smoothies with protein powder unless you can find one you really love.

## 2016-07-31 NOTE — Progress Notes (Signed)
Pre visit review using our clinic review tool, if applicable. No additional management support is needed unless otherwise documented below in the visit note. 

## 2016-08-01 DIAGNOSIS — Z8639 Personal history of other endocrine, nutritional and metabolic disease: Secondary | ICD-10-CM | POA: Insufficient documentation

## 2016-08-01 DIAGNOSIS — I2089 Other forms of angina pectoris: Secondary | ICD-10-CM | POA: Insufficient documentation

## 2016-08-01 DIAGNOSIS — E034 Atrophy of thyroid (acquired): Secondary | ICD-10-CM | POA: Insufficient documentation

## 2016-08-01 DIAGNOSIS — I208 Other forms of angina pectoris: Secondary | ICD-10-CM | POA: Insufficient documentation

## 2016-08-01 NOTE — Assessment & Plan Note (Signed)
I have addressed  BMI and recommended wt loss of 10% of body weight over the next 6 months using a low fat, low starch, high protein  fruit/vegetable based Mediterranean diet and 30 minutes of aerobic exercise a minimum of 5 days per week.   

## 2016-08-01 NOTE — Assessment & Plan Note (Signed)
Suspected by her reports of nausea brought on by exercise and early morning symptoms,  Referring to Cardiology for risk stratification

## 2016-08-01 NOTE — Assessment & Plan Note (Signed)
Given her weight gain,  Hyperlipidemia,  Fatigue, I recommended treatment.  Low dose levothyroxine prescribed.  Return in 6 weeks for TSh

## 2016-08-01 NOTE — Assessment & Plan Note (Addendum)
Annual comprehensive preventive exam was done as well as an evaluation and management of chronic conditions .  During the course of the visit the patient was educated and counseled about appropriate screening and preventive services including :  diabetes screening, lipid analysis with projected  10 year  risk for CAD , nutrition counseling, breast, cervical and colorectal cancer screening, and recommended immunizations.  Printed recommendations for health maintenance screenings was given 

## 2016-08-09 ENCOUNTER — Encounter: Payer: Self-pay | Admitting: Internal Medicine

## 2016-08-20 ENCOUNTER — Encounter: Payer: Self-pay | Admitting: *Deleted

## 2016-08-20 ENCOUNTER — Encounter: Payer: Self-pay | Admitting: General Surgery

## 2016-08-28 ENCOUNTER — Encounter: Payer: Self-pay | Admitting: Internal Medicine

## 2016-08-28 ENCOUNTER — Ambulatory Visit (INDEPENDENT_AMBULATORY_CARE_PROVIDER_SITE_OTHER): Payer: BLUE CROSS/BLUE SHIELD | Admitting: Internal Medicine

## 2016-08-28 VITALS — BP 130/80 | HR 76 | Ht 63.0 in | Wt 196.0 lb

## 2016-08-28 DIAGNOSIS — R079 Chest pain, unspecified: Secondary | ICD-10-CM | POA: Diagnosis not present

## 2016-08-28 DIAGNOSIS — R9431 Abnormal electrocardiogram [ECG] [EKG]: Secondary | ICD-10-CM

## 2016-08-28 DIAGNOSIS — Z7689 Persons encountering health services in other specified circumstances: Secondary | ICD-10-CM

## 2016-08-28 NOTE — Progress Notes (Signed)
New Outpatient Visit Date: 08/28/2016  Referring Provider: Crecencio Mc, MD [address] [place], [place] [address]  Chief Complaint: Nausea with exercise  HPI:  Marie Howard is a 55 y.o. year-old female with history of hyperlipidemia, GERD, and obesity, who has been referred by Dr. Derrel Nip for evaluation of exertional nausea concerning for anginal equivalent. The patient reports intermittent nausea and dyspepsia when exercising first thing in the morning over the course of the last few years. This typically resolves when chewing gum. She has also had a few episodes where she has marked fatigue and difficulty sleeping. In the morning, the patient awakens with headache and nausea. She cannot remember the last time this happened. The patient is concerned by these symptoms because several friends/relatives had similar feelings prior to heart attack.  The patient denies chest pain with the exception of intermittent heartburn that resolves with Rolaids. She denies shortness of breath, palpitations, edema, orthopnea, and PND. She notes rare episodes of orthostatic lightheadedness. She has not fallen. She exercises 2 times per week, typically going 1 mile on an elliptical trainer and then performing weightbearing exercises with her trainer for about an hour. The patient notes that she has gained weight recently, which has led to decreased exercise tolerance.  Ms. Seehafer notes episode of left shoulder pain and shortness of breath in her 58s. Chest x-ray and EKG performed at that time were reportedly negative. She has not had any additional cardiac workup since then.  --------------------------------------------------------------------------------------------------  Cardiovascular History & Procedures: Cardiovascular Problems:  Possible angina and abnormal EKG  Risk Factors:  Hyperlipidemia and obesity  Cath/PCI:  None.  CV Surgery:  None.  EP Procedures and  Devices:  None.  Non-Invasive Evaluation(s):  None.  Recent CV Pertinent Labs: Lab Results  Component Value Date   CHOL 245 (H) 07/26/2016   HDL 65 07/26/2016   LDLCALC 155 (H) 07/26/2016   TRIG 127 07/26/2016   CHOLHDL 3.8 07/26/2016   K 4.3 07/26/2016   BUN 12 07/26/2016   CREATININE 0.92 07/26/2016    --------------------------------------------------------------------------------------------------  Past Medical History:  Diagnosis Date  . Arthritis   . GERD (gastroesophageal reflux disease)   . History of chicken pox   . Hyperlipidemia     History reviewed. No pertinent surgical history.  Outpatient Encounter Prescriptions as of 08/28/2016  Medication Sig  . ergocalciferol (DRISDOL) 50000 units capsule Take 1 capsule (50,000 Units total) by mouth once a week.  Marland Kitchen OVER THE COUNTER MEDICATION Take 1 tablet by mouth daily. Airborne vitamin c  . Probiotic Product (PROBIOTIC ADVANCED PO) Take 1 tablet by mouth daily.  . [DISCONTINUED] DHA-EPA-Vitamin E (OMEGA-3 COMPLEX PO) Take 2 capsules by mouth daily as needed.   . [DISCONTINUED] levothyroxine (SYNTHROID) 50 MCG tablet Take 1 tablet (50 mcg total) by mouth daily before breakfast.  . [DISCONTINUED] OVER THE COUNTER MEDICATION 2 capsules daily. Micro plex food nutrient  . [DISCONTINUED] OVER THE COUNTER MEDICATION 1 capsule daily. Bone nutrient  . [DISCONTINUED] OVER THE COUNTER MEDICATION 2 capsules daily. Cellular vitality complex   No facility-administered encounter medications on file as of 08/28/2016.     Allergies: Macrobid [nitrofurantoin monohyd macro]  Social History   Social History  . Marital status: Married    Spouse name: N/A  . Number of children: N/A  . Years of education: N/A   Occupational History  . Not on file.   Social History Main Topics  . Smoking status: Never Smoker  . Smokeless tobacco:  Never Used  . Alcohol use Yes     Comment: 1 glass of wine or mixed drink a month  . Drug  use: No  . Sexual activity: Yes   Other Topics Concern  . Not on file   Social History Narrative  . No narrative on file    Family History  Problem Relation Age of Onset  . Arthritis Mother   . Hyperlipidemia Mother   . Hypertension Mother   . Arthritis Father   . Hyperlipidemia Father   . Heart disease Father 45    CAD s/p 2 vessel CABG   . Hypertension Father   . Heart disease Paternal Grandmother   . Hyperlipidemia Paternal Grandmother   . Stroke Paternal Grandmother   . Cancer Paternal Grandfather     colon    Review of Systems: A 12-system review of systems was performed and was negative except as noted in the HPI.  --------------------------------------------------------------------------------------------------  Physical Exam: BP 130/80 (BP Location: Left Arm, Patient Position: Sitting, Cuff Size: Normal)   Pulse 76   Ht 5\' 3"  (1.6 m)   Wt 196 lb (88.9 kg)   SpO2 98%   BMI 34.72 kg/m   General:  Obese woman, seated comfortably in the exam room. HEENT: No conjunctival pallor or scleral icterus.  Moist mucous membranes.  OP clear. Neck: Supple without lymphadenopathy, thyromegaly, JVD, or HJR.  No carotid bruit. Lungs: Normal work of breathing.  Clear to auscultation bilaterally without wheezes or crackles. Heart: Regular rate and rhythm without murmurs, rubs, or gallops.  Non-displaced PMI. Abd: Bowel sounds present.  Soft, NT/ND without hepatosplenomegaly Ext: No lower extremity edema.  Radial, PT, and DP pulses are 2+ bilaterally Skin: warm and dry without rash Neuro: CNIII-XII intact.  Strength and fine-touch sensation intact in upper and lower extremities bilaterally. Psych: Normal mood and affect.  EKG:  Normal sinus rhythm with poor R-wave progression that may reflect lead placement or prior anterolateral infarct.  Lab Results  Component Value Date   WBC 5.1 07/26/2016   HGB 13.9 07/08/2014   HCT 40.6 07/26/2016   MCV 91 07/26/2016   PLT 267  07/26/2016    Lab Results  Component Value Date   NA 141 07/26/2016   K 4.3 07/26/2016   CL 100 07/26/2016   CO2 24 07/26/2016   BUN 12 07/26/2016   CREATININE 0.92 07/26/2016   GLUCOSE 96 07/26/2016   ALT 38 (H) 07/26/2016    Lab Results  Component Value Date   CHOL 245 (H) 07/26/2016   HDL 65 07/26/2016   LDLCALC 155 (H) 07/26/2016   TRIG 127 07/26/2016   CHOLHDL 3.8 07/26/2016     --------------------------------------------------------------------------------------------------  ASSESSMENT AND PLAN: 1. Chest pain and abnormal EKG Patient reports intermittent heartburn as well as exercise induced nausea that has occurred sporadically over several years. Consolation of symptoms is atypical for coronary artery disease. However, her EKG today is abnormal demonstrating poor R-wave progression that could reflect lead placement or prior anterolateral MI. In light of her risk factors, including hyperlipidemia and obesity, as well as abnormal EKG with nonspecific symptoms, we have agreed to perform an exercise stress echocardiogram. Will not make any medication changes today.  Follow-up: We will review the results of her stress echo by phone. She'll return to clinic for follow-up in 3 months.  Nelva Bush, MD 08/28/2016 9:07 AM

## 2016-08-28 NOTE — Patient Instructions (Signed)
Testing/Procedures: Your physician has requested that you have a stress echocardiogram. For further information please visit [email or web address]. Please follow instruction sheet as given.   Do not drink or eat foods with caffeine for 24 hours before the test.  If you use an inhaler, bring it with you to the test.  Do not smoke for 4 hours before the test.  Wear comfortable shoes and clothing.  Follow-Up: Your physician recommends that you schedule a follow-up appointment in: 3 months with Dr. Saunders Revel.  It was a pleasure seeing you today here in the office. Please do not hesitate to give [place] a call back if you have any further questions. [place], BSN     Exercise Stress Echocardiogram An exercise stress echocardiogram is a heart (cardiac) test used to check the function of your heart. This test may also be called an exercise stress echocardiography or stress echo. This stress test will check how well your heart muscle and valves are working and determine if your heart muscle is getting enough blood. You will exercise on a treadmill to naturally increase or stress the functioning of your heart.  An echocardiogram uses sound waves (ultrasound) to produce an image of your heart. If your heart does not work normally, it may indicate coronary artery disease with poor coronary blood supply. The coronary arteries are the arteries that bring blood and oxygen to your heart. LET [facility] CARE PROVIDER KNOW ABOUT:  Any allergies you have.  All medicines you are taking, including vitamins, herbs, eye drops, creams, and over-the-counter medicines.  Previous problems you or members of your family have had with the use of anesthetics.  Any blood disorders you have.  Previous surgeries you have had.  Medical conditions you have.  Possibility of pregnancy, if this applies. RISKS AND COMPLICATIONS Generally, this is a safe procedure. However, as with any procedure,  complications can occur. Possible complications can include:  You develop pain or pressure in the following areas:  Chest.  Jaw or neck.  Between your shoulder blades.  Radiating down your left arm.  Dizziness or lightheadedness.  Shortness of breath.  Increased or irregular heartbeat.  Nausea or vomiting.  Heart attack (rare). BEFORE THE PROCEDURE  Avoid all forms of caffeine for 24 hours before your test or as directed by your health care provider. This includes coffee, tea (even decaffeinated tea), caffeinated sodas, chocolate, cocoa, and certain pain medicines.  Follow your health care provider's instructions regarding eating and drinking before the test.  Take your medicines as directed at regular times with water unless instructed otherwise. Exceptions may include:  If you have diabetes, ask how you are to take your insulin or pills. It is common to adjust insulin dosing the morning of the test.  If you are taking beta-blocker medicines, it is important to talk to your health care provider about these medicines well before the date of your test. Taking beta-blocker medicines may interfere with the test. In some cases, these medicines need to be changed or stopped 24 hours or more before the test.  If you wear a nitroglycerin patch, it may need to be removed prior to the test. Ask your health care provider if the patch should be removed before the test.  If you use an inhaler for any breathing condition, bring it with you to the test.  If you are an outpatient, bring a snack so you can eat right after the stress phase of the test.  Do not  smoke for 4 hours prior to the test or as directed by your health care provider.  Wear loose-fitting clothes and comfortable shoes for the test. This test involves walking on a treadmill. PROCEDURE   Multiple electrodes will be put on your chest. If needed, small areas of your chest may be shaved to get better contact with the  electrodes. Once the electrodes are attached to your body, multiple wires will be attached to the electrodes, and your heart rate will be monitored.  You will have an echocardiogram done at rest.  To produce this image of your heart, gel is applied to your chest, and a wand-like tool (transducer) is moved over the chest. The transducer sends the sound waves through the chest to create the moving images of your heart.  You may need an IV to receive a medication that improves the quality of the pictures.  You will then walk on a treadmill. The treadmill will be started at a slow pace. The treadmill speed and incline will gradually be increased to raise your heart rate.  At the peak of exercise, the treadmill will be stopped. You will lie down immediately on a bed so that a second echocardiogram can be done to visualize your heart's motion with exercise.  The test usually takes 30-60 minutes to complete. AFTER THE PROCEDURE  Your heart rate and blood pressure will be monitored after the test.  You may return to your normal schedule, including diet, activities, and medicines, unless your health care provider tells you otherwise.   This information is not intended to replace advice given to you by your health care provider. Make sure you discuss any questions you have with your health care provider.   Document Released: 11/09/2004 Document Revised: 11/10/2013 Document Reviewed: 07/13/2013 Elsevier Interactive Patient Education [employer].

## 2016-08-30 ENCOUNTER — Ambulatory Visit: Payer: Self-pay | Admitting: General Surgery

## 2016-09-11 ENCOUNTER — Ambulatory Visit
Admission: RE | Admit: 2016-09-11 | Discharge: 2016-09-11 | Disposition: A | Discharge status: Home / Self Care | Payer: BLUE CROSS/BLUE SHIELD | Source: Ambulatory Visit | Attending: Internal Medicine | Admitting: Internal Medicine

## 2016-09-11 DIAGNOSIS — R928 Other abnormal and inconclusive findings on diagnostic imaging of breast: Secondary | ICD-10-CM | POA: Diagnosis not present

## 2016-09-11 DIAGNOSIS — Z1231 Encounter for screening mammogram for malignant neoplasm of breast: Secondary | ICD-10-CM | POA: Diagnosis not present

## 2016-09-11 DIAGNOSIS — Z1239 Encounter for other screening for malignant neoplasm of breast: Secondary | ICD-10-CM

## 2016-09-12 ENCOUNTER — Ambulatory Visit (INDEPENDENT_AMBULATORY_CARE_PROVIDER_SITE_OTHER): Payer: BLUE CROSS/BLUE SHIELD

## 2016-09-12 DIAGNOSIS — R9431 Abnormal electrocardiogram [ECG] [EKG]: Secondary | ICD-10-CM | POA: Diagnosis not present

## 2016-09-12 DIAGNOSIS — R079 Chest pain, unspecified: Secondary | ICD-10-CM

## 2016-09-12 LAB — ECHOCARDIOGRAM STRESS TEST
CHL CUP MPHR: 165 {beats}/min
CHL CUP RESTING HR STRESS: 80 {beats}/min
CSEPED: 9 min
CSEPHR: 95 %
CSEPPHR: 157 {beats}/min
Estimated workload: 11 METS
Exercise duration (sec): 35 s

## 2016-09-13 ENCOUNTER — Other Ambulatory Visit: Payer: Self-pay | Admitting: Internal Medicine

## 2016-09-13 ENCOUNTER — Encounter: Payer: Self-pay | Admitting: Internal Medicine

## 2016-09-13 DIAGNOSIS — N6320 Unspecified lump in the left breast, unspecified quadrant: Secondary | ICD-10-CM

## 2016-09-19 ENCOUNTER — Ambulatory Visit: Payer: Self-pay | Admitting: General Surgery

## 2016-09-25 ENCOUNTER — Encounter: Payer: Self-pay | Admitting: *Deleted

## 2016-09-27 ENCOUNTER — Ambulatory Visit
Admission: RE | Admit: 2016-09-27 | Discharge: 2016-09-27 | Disposition: A | Discharge status: Home / Self Care | Payer: BLUE CROSS/BLUE SHIELD | Source: Ambulatory Visit | Attending: Internal Medicine | Admitting: Internal Medicine

## 2016-09-27 DIAGNOSIS — N6320 Unspecified lump in the left breast, unspecified quadrant: Secondary | ICD-10-CM

## 2016-09-30 DIAGNOSIS — N6320 Unspecified lump in the left breast, unspecified quadrant: Secondary | ICD-10-CM | POA: Insufficient documentation

## 2016-09-30 NOTE — Assessment & Plan Note (Signed)
Not palpable.  Seen on mammogram and benign appearing on ultrasound,  Nov 2017.  6 month follow up advised

## 2016-11-20 ENCOUNTER — Telehealth: Payer: Self-pay | Admitting: Internal Medicine

## 2016-11-20 NOTE — Telephone Encounter (Signed)
Patient wants to see if appointment for fu is needed on Friday.  She was told that results were fine.  Patient wants to clarify if she needs a 3 m fu .  Please call.

## 2016-11-20 NOTE — Telephone Encounter (Signed)
Left detailed message for pt that she can cancel appt if she feels that she is ok, but she can keep appt if she would like to go over results w/ Dr. Saunders Revel.  Asked her to call tomorrow and let us know if she would like to keep this appt or not.

## 2016-11-22 NOTE — Progress Notes (Deleted)
Follow-up Outpatient Visit Date: 11/23/2016  Chief Complaint: Follow-up atypical chest pain  HPI:  Marie Howard is a 56 y.o. year-old female with history of hyperlipidemia, GERD, and obesity, who presents for follow-up of atypical chest pain.  I first met the patient in 08/2016, at which time she reported intermittent nausea and dyspepsia while exercising in the morning for the past several years.  She also endorsed occasional chest burning that resolves with Rolaids.  The patient and her PCP were concerned that this could represent an anginal equivalent.  Marie Howard was referred for exercise stress echocardiogram, which did not show any significant abnormalities.  --------------------------------------------------------------------------------------------------  Cardiovascular History & Procedures: Cardiovascular Problems:  Atypical chest pain and abnormal EKG  Risk Factors:  Hyperlipidemia and obesity  Cath/PCI:  None  CV Surgery:  None  EP Procedures and Devices:  None  Non-Invasive Evaluation(s):  Exercise stress echo (09/12/16): Normal study.  Good exercise capacity (9 min, 35 sec, 11 METS), achieving 153 bpm (93% MPHR).  Baseline LVEF 60-65%.  Normal hyperdynamic response to stress without wall motion abnormalities.  Recent CV Pertinent Labs: Lab Results  Component Value Date   CHOL 245 (H) 07/26/2016   HDL 65 07/26/2016   LDLCALC 155 (H) 07/26/2016   TRIG 127 07/26/2016   CHOLHDL 3.8 07/26/2016   K 4.3 07/26/2016   BUN 12 07/26/2016   CREATININE 0.92 07/26/2016    Past medical and surgical history were reviewed and updated in EPIC.   Outpatient Encounter Prescriptions as of 11/23/2016  Medication Sig  . ergocalciferol (DRISDOL) 50000 units capsule Take 1 capsule (50,000 Units total) by mouth once a week.  Marland Kitchen OVER THE COUNTER MEDICATION Take 1 tablet by mouth daily. Airborne vitamin c  . Probiotic Product (PROBIOTIC ADVANCED PO) Take 1 tablet by mouth daily.     No facility-administered encounter medications on file as of 11/23/2016.     Allergies: Macrobid [nitrofurantoin monohyd macro]  Social History   Social History  . Marital status: Married    Spouse name: N/A  . Number of children: N/A  . Years of education: N/A   Occupational History  . Not on file.   Social History Main Topics  . Smoking status: Never Smoker  . Smokeless tobacco: Never Used  . Alcohol use Yes     Comment: 1 glass of wine or mixed drink a month  . Drug use: No  . Sexual activity: Yes   Other Topics Concern  . Not on file   Social History Narrative  . No narrative on file    Family History  Problem Relation Age of Onset  . Arthritis Mother   . Hyperlipidemia Mother   . Hypertension Mother   . Arthritis Father   . Hyperlipidemia Father   . Heart disease Father 46    CAD s/p 2 vessel CABG   . Hypertension Father   . Heart disease Paternal Grandmother   . Hyperlipidemia Paternal Grandmother   . Stroke Paternal Grandmother   . Cancer Paternal Grandfather     colon    Review of Systems: A 12-system review of systems was performed and was negative except as noted in the HPI.  --------------------------------------------------------------------------------------------------  Physical Exam: There were no vitals taken for this visit.  General:  *** HEENT: No conjunctival pallor or scleral icterus.  Moist mucous membranes.  OP clear. Neck: Supple without lymphadenopathy, thyromegaly, JVD, or HJR.  No carotid bruit. Lungs: Normal work of breathing.  Clear to auscultation bilaterally without  wheezes or crackles. Heart: Regular rate and rhythm without murmurs, rubs, or gallops.  Non-displaced PMI. Abd: Bowel sounds present.  Soft, NT/ND without hepatosplenomegaly Ext: No lower extremity edema.  Radial, PT, and DP pulses are 2+ bilaterally. Skin: warm and dry without rash  EKG:  ***  Lab Results  Component Value Date   WBC 5.1 07/26/2016   HGB  13.9 07/08/2014   HCT 40.6 07/26/2016   MCV 91 07/26/2016   PLT 267 07/26/2016    Lab Results  Component Value Date   NA 141 07/26/2016   K 4.3 07/26/2016   CL 100 07/26/2016   CO2 24 07/26/2016   BUN 12 07/26/2016   CREATININE 0.92 07/26/2016   GLUCOSE 96 07/26/2016   ALT 38 (H) 07/26/2016    Lab Results  Component Value Date   CHOL 245 (H) 07/26/2016   HDL 65 07/26/2016   LDLCALC 155 (H) 07/26/2016   TRIG 127 07/26/2016   CHOLHDL 3.8 07/26/2016    --------------------------------------------------------------------------------------------------  ASSESSMENT AND PLAN: Marie Gave Marie Scifres, MD 11/22/2016 8:50 AM

## 2016-11-22 NOTE — Telephone Encounter (Signed)
Patient called scheduler and rescheduled appt for 12/04/16 with Dr End.

## 2016-11-23 ENCOUNTER — Ambulatory Visit: Payer: BLUE CROSS/BLUE SHIELD | Admitting: Internal Medicine

## 2016-12-04 ENCOUNTER — Ambulatory Visit: Payer: BLUE CROSS/BLUE SHIELD | Admitting: Internal Medicine

## 2016-12-04 NOTE — Progress Notes (Deleted)
Follow-up Outpatient Visit Date: 12/04/2016  Primary Care Provider: Crecencio Mc, MD [address] [place] [address]  Chief Complaint: Follow-up exertional nausea concerning for angina  HPI:  Ms. Marie Howard is a 56 y.o. year-old female with history of hyperlipidemia, GERD, and obesity, who presents for follow-up of exertional nausea that was concerning for anginal equivalent. Symptoms have been present for several years. Though she did not have other concerning symptoms such as chest pain or shortness of breath, we agreed to proceed with an exercise echocardiogram stress test that was normal with good functional capacity.  --------------------------------------------------------------------------------------------------  Cardiovascular History & Procedures: Cardiovascular Problems:  Exertional nausea and abnormal EKG  Risk Factors:  Hyperlipidemia and obesity  Cath/PCI:  None  CV Surgery:  None  EP Procedures and Devices:  None  Non-Invasive Evaluation(s):  Exercise stress echocardiogram (09/12/16): Normal baseline LV function with hyperdynamic response to stress. No regional wall motion abnormalities. Good exercise capacity (9 minutes, 35 seconds, 11 METS) achieving 153 bpm (93% MPHR). No significant EKG changes noted.  Recent CV Pertinent Labs: Lab Results  Component Value Date   CHOL 245 (H) 07/26/2016   HDL 65 07/26/2016   LDLCALC 155 (H) 07/26/2016   TRIG 127 07/26/2016   CHOLHDL 3.8 07/26/2016   K 4.3 07/26/2016   BUN 12 07/26/2016   CREATININE 0.92 07/26/2016    Past medical and surgical history were reviewed and updated in EPIC.  Outpatient Encounter Prescriptions as of 12/04/2016  Medication Sig  . ergocalciferol (DRISDOL) 50000 units capsule Take 1 capsule (50,000 Units total) by mouth once a week.  Marland Kitchen OVER THE COUNTER MEDICATION Take 1 tablet by mouth daily. Airborne vitamin c  . Probiotic Product (PROBIOTIC ADVANCED PO) Take 1  tablet by mouth daily.   No facility-administered encounter medications on file as of 12/04/2016.     Allergies: Macrobid [nitrofurantoin monohyd macro]  Social History   Social History  . Marital status: Married    Spouse name: N/A  . Number of children: N/A  . Years of education: N/A   Occupational History  . Not on file.   Social History Main Topics  . Smoking status: Never Smoker  . Smokeless tobacco: Never Used  . Alcohol use Yes     Comment: 1 glass of wine or mixed drink a month  . Drug use: No  . Sexual activity: Yes   Other Topics Concern  . Not on file   Social History Narrative  . No narrative on file    Family History  Problem Relation Age of Onset  . Arthritis Mother   . Hyperlipidemia Mother   . Hypertension Mother   . Arthritis Father   . Hyperlipidemia Father   . Heart disease Father 54    CAD s/p 2 vessel CABG   . Hypertension Father   . Heart disease Paternal Grandmother   . Hyperlipidemia Paternal Grandmother   . Stroke Paternal Grandmother   . Cancer Paternal Grandfather     colon    Review of Systems: A 12-system review of systems was performed and was negative except as noted in the HPI.  --------------------------------------------------------------------------------------------------  Physical Exam: There were no vitals taken for this visit.  General:  *** HEENT: No conjunctival pallor or scleral icterus.  Moist mucous membranes.  OP clear. Neck: Supple without lymphadenopathy, thyromegaly, JVD, or HJR.  No carotid bruit. Lungs: Normal work of breathing.  Clear to auscultation bilaterally without wheezes or crackles. Heart: Regular rate and  rhythm without murmurs, rubs, or gallops.  Non-displaced PMI. Abd: Bowel sounds present.  Soft, NT/ND without hepatosplenomegaly Ext: No lower extremity edema.  Radial, PT, and DP pulses are 2+ bilaterally. Skin: warm and dry without rash  EKG:  ***  Lab Results  Component Value Date    WBC 5.1 07/26/2016   HGB 13.9 07/08/2014   HCT 40.6 07/26/2016   MCV 91 07/26/2016   PLT 267 07/26/2016    Lab Results  Component Value Date   NA 141 07/26/2016   K 4.3 07/26/2016   CL 100 07/26/2016   CO2 24 07/26/2016   BUN 12 07/26/2016   CREATININE 0.92 07/26/2016   GLUCOSE 96 07/26/2016   ALT 38 (H) 07/26/2016    Lab Results  Component Value Date   CHOL 245 (H) 07/26/2016   HDL 65 07/26/2016   LDLCALC 155 (H) 07/26/2016   TRIG 127 07/26/2016   CHOLHDL 3.8 07/26/2016    --------------------------------------------------------------------------------------------------  ASSESSMENT AND PLAN: Marie Gave Rahman Ferrall, MD 12/04/2016 8:30 AM

## 2016-12-10 ENCOUNTER — Encounter: Payer: Self-pay | Admitting: Internal Medicine

## 2016-12-11 ENCOUNTER — Encounter: Payer: Self-pay | Admitting: Internal Medicine

## 2016-12-11 ENCOUNTER — Ambulatory Visit (INDEPENDENT_AMBULATORY_CARE_PROVIDER_SITE_OTHER): Payer: BLUE CROSS/BLUE SHIELD | Admitting: Internal Medicine

## 2016-12-11 VITALS — BP 110/70 | HR 66 | Ht 63.0 in | Wt 191.0 lb

## 2016-12-11 DIAGNOSIS — E7800 Pure hypercholesterolemia, unspecified: Secondary | ICD-10-CM

## 2016-12-11 DIAGNOSIS — R110 Nausea: Secondary | ICD-10-CM

## 2016-12-11 DIAGNOSIS — R0789 Other chest pain: Secondary | ICD-10-CM

## 2016-12-11 NOTE — Progress Notes (Signed)
Follow-up Outpatient Visit Date: 12/11/2016  Primary Care Provider: Crecencio Mc, MD [address] [place] [address]  Chief Complaint: Follow-up exertional nausea  HPI:  Marie Howard is a 56 y.o. year-old female with history of HLD, GERD, and obesity, who presents for follow-up of exertional nausea.  If first met [name] in 08/2016, at which time she reported exertional nausea (usually only in the morning) as well as occasional heartburn.  We proceeded with an exercise stress echo, which was normal.  Today, [name] reports that she has felt well with the exception of a recent sinus infection.  She has not had any recent episodes of chest pain or exertional nausea (though she has not be exercising as regularly).  She also denies shortness of breath, palpitations, lightheadedness, edema, orthopnea, and PND.  --------------------------------------------------------------------------------------------------  Cardiovascular History & Procedures: Cardiovascular Problems:  Exertional nausea and abnormal EKG  Risk Factors:  Hyperlipidemia and obesity  Cath/PCI:  Nond  CV Surgery:  None  EP Procedures and Devices:  None  Non-Invasive Evaluation(s):  Exercise stress echo (09/12/16): Normal baseline LV function with hyperdynamic response to stress.  No regional wall motion abnormalities.  Patient exercised 9 min, 35 seconds (11 METS) reaching 153 bpm (93% MPHR).  Recent CV Pertinent Labs: Lab Results  Component Value Date   CHOL 245 (H) 07/26/2016   HDL 65 07/26/2016   LDLCALC 155 (H) 07/26/2016   TRIG 127 07/26/2016   CHOLHDL 3.8 07/26/2016   K 4.3 07/26/2016   BUN 12 07/26/2016   CREATININE 0.92 07/26/2016    Past medical and surgical history were reviewed and updated in EPIC.  Outpatient Encounter Prescriptions as of 12/11/2016  Medication Sig  . ergocalciferol (DRISDOL) 50000 units capsule Take 1 capsule (50,000 Units total) by mouth once a  week.  Marland Kitchen OVER THE COUNTER MEDICATION Take 1 tablet by mouth daily. Airborne vitamin c  . Probiotic Product (PROBIOTIC ADVANCED PO) Take 1 tablet by mouth daily.   No facility-administered encounter medications on file as of 12/11/2016.     Allergies: Macrobid [nitrofurantoin monohyd macro]  Social History   Social History  . Marital status: Married    Spouse name: N/A  . Number of children: N/A  . Years of education: N/A   Occupational History  . Not on file.   Social History Main Topics  . Smoking status: Never Smoker  . Smokeless tobacco: Never Used  . Alcohol use Yes     Comment: 1 glass of wine or mixed drink a month  . Drug use: No  . Sexual activity: Yes   Other Topics Concern  . Not on file   Social History Narrative  . No narrative on file    Family History  Problem Relation Age of Onset  . Arthritis Mother   . Hyperlipidemia Mother   . Hypertension Mother   . Arthritis Father   . Hyperlipidemia Father   . Heart disease Father 50    CAD s/p 2 vessel CABG   . Hypertension Father   . Heart disease Paternal Grandmother   . Hyperlipidemia Paternal Grandmother   . Stroke Paternal Grandmother   . Cancer Paternal Grandfather     colon    Review of Systems: A 12-system review of systems was performed and was negative except as noted in the HPI.  --------------------------------------------------------------------------------------------------  Physical Exam: BP 110/70 (BP Location: Left Arm, Patient Position: Sitting, Cuff Size: Large)   Pulse 66   Ht 5'  3" (1.6 m)   Wt 191 lb (86.6 kg)   BMI 33.83 kg/m   General:  Obese woman, seated comfortably in the exam room HEENT: No conjunctival pallor or scleral icterus.  Moist mucous membranes.  OP clear. Neck: Supple without lymphadenopathy, thyromegaly, JVD, or HJR. Lungs: Normal work of breathing.  Clear to auscultation bilaterally without wheezes or crackles. Heart: Regular rate and rhythm without  murmurs, rubs, or gallops.  Non-displaced PMI. Abd: Bowel sounds present.  Soft, NT/ND without hepatosplenomegaly Ext: No lower extremity edema.  Radial, PT, and DP pulses are 2+ bilaterally. Skin: warm and dry without rash  Lab Results  Component Value Date   WBC 5.1 07/26/2016   HGB 13.9 07/08/2014   HCT 40.6 07/26/2016   MCV 91 07/26/2016   PLT 267 07/26/2016    Lab Results  Component Value Date   NA 141 07/26/2016   K 4.3 07/26/2016   CL 100 07/26/2016   CO2 24 07/26/2016   BUN 12 07/26/2016   CREATININE 0.92 07/26/2016   GLUCOSE 96 07/26/2016   ALT 38 (H) 07/26/2016    Lab Results  Component Value Date   CHOL 245 (H) 07/26/2016   HDL 65 07/26/2016   LDLCALC 155 (H) 07/26/2016   TRIG 127 07/26/2016   CHOLHDL 3.8 07/26/2016    --------------------------------------------------------------------------------------------------  ASSESSMENT AND PLAN: Exertional nausea and non-cardiac chest pain The patient has not had any significant symptoms since our last visit.  Her exercise stress echo was very reassuring; further cardiac testing is not needed at this time.  We discussed lifestyle modifications to assist with primary prevention, including diet and exercise with goal of losing weight.  Hyperlipidemia LDL last year was noted to be 155.  As above, we discussed lifestyle modification as a first step, as the patient would like to avoid pharmacologic therapy if possible.  [name] should continue to follow with Dr. Derrel Nip.  Follow-up: Return to clinic as needed.  Nelva Bush, MD 12/11/2016 9:36 PM

## 2016-12-11 NOTE — Patient Instructions (Signed)
Medication Instructions:  Your physician recommends that you continue on your current medications as directed. Please refer to the Current Medication list given to you today.   Labwork: none  Testing/Procedures: none  Follow-Up: Your physician recommends that you schedule a follow-up appointment in: ON AN AS NEEDED BASIS.  If you need a refill on your cardiac medications before your next appointment, please call your pharmacy.

## 2017-08-05 ENCOUNTER — Other Ambulatory Visit: Payer: Self-pay | Admitting: Internal Medicine

## 2017-08-05 DIAGNOSIS — N6320 Unspecified lump in the left breast, unspecified quadrant: Secondary | ICD-10-CM

## 2017-08-14 NOTE — Telephone Encounter (Signed)
Error

## 2017-08-14 NOTE — Telephone Encounter (Signed)
Orders

## 2017-08-15 ENCOUNTER — Telehealth: Payer: Self-pay | Admitting: Internal Medicine

## 2017-08-15 NOTE — Telephone Encounter (Signed)
Pt called and stated that she had pain in her left foot and a burning sensation  Going from the top of her foot to the arch. Please advise, thank you!  Call pt @ [phone]

## 2017-08-15 NOTE — Telephone Encounter (Addendum)
Nothing to add. Does not sound like a blood clot to me,  But without seeing her I cannot advise her

## 2017-08-15 NOTE — Telephone Encounter (Signed)
Patient called stating that Left foot was hurting and had a burning sensation in her toes. Denies redness, swelling, warm to touch. Patient says that symptoms are no longer present at the moment. She was having them last night and was concerned she may have a clot after looking at [email or web address] Patient has been on her feet a lot this past week and said that she had on shoes that were very uncomfortable this past weekend. Advised patient to monitor for swelling, redness, pain, etc. Also told patient to call us back if any new symptoms occur. Any other recommendations for patient?

## 2017-08-15 NOTE — Telephone Encounter (Signed)
Error

## 2017-08-15 NOTE — Telephone Encounter (Signed)
Attempted to reach the patient to discuss symptoms further, left a VM to return my call.

## 2017-08-15 NOTE — Telephone Encounter (Signed)
Please call pt at [phone]

## 2017-08-15 NOTE — Telephone Encounter (Signed)
Can you please call and triage her, thanks

## 2017-08-16 ENCOUNTER — Telehealth: Payer: Self-pay

## 2017-08-16 DIAGNOSIS — N6320 Unspecified lump in the left breast, unspecified quadrant: Secondary | ICD-10-CM

## 2017-08-16 NOTE — Telephone Encounter (Signed)
-----   Message from Eustace Pen sent at 08/14/2017  1:58 PM EDT ----- Regarding: mammogram Graylon Good, Can you change this patient's mammogram to a bilateral diagnostic tomo ([id number] and also the limited u/s's ([id number] and IMG [id number])? diagnosis N63.20-left breast mass  Thank you! Melissa

## 2017-08-19 ENCOUNTER — Encounter: Payer: Self-pay | Admitting: Internal Medicine

## 2017-08-27 ENCOUNTER — Telehealth: Payer: Self-pay | Admitting: Internal Medicine

## 2017-08-27 DIAGNOSIS — E559 Vitamin D deficiency, unspecified: Secondary | ICD-10-CM

## 2017-08-27 DIAGNOSIS — R5383 Other fatigue: Secondary | ICD-10-CM

## 2017-08-27 DIAGNOSIS — E034 Atrophy of thyroid (acquired): Secondary | ICD-10-CM

## 2017-08-27 DIAGNOSIS — R748 Abnormal levels of other serum enzymes: Secondary | ICD-10-CM

## 2017-08-27 DIAGNOSIS — E785 Hyperlipidemia, unspecified: Secondary | ICD-10-CM

## 2017-08-27 NOTE — Telephone Encounter (Signed)
Appointment For: Marie Howard ([id number])  Visit Type: [place] ([id number])    09/27/2017  3:00 PM 30 mins. Crecencio Mc, MD    [facility]    Patient Comments:  Annual Physical  could you submit the same lab work from my prev exam.   I wonder if I need more thyroid tests since last   year's was high? I would like to do my blood work   before my checkup. I am a [facility] employee if you   can submit to the [facility]. Thks.

## 2017-08-29 NOTE — Telephone Encounter (Signed)
Does she need to have her tsh rechked?

## 2017-08-29 NOTE — Telephone Encounter (Signed)
Labs ordered through [facility],  Will need to pick up orders

## 2017-08-29 NOTE — Addendum Note (Signed)
Addended by: Crecencio Mc on: 08/29/2017 03:40 PM   Modules accepted: Orders

## 2017-09-13 ENCOUNTER — Other Ambulatory Visit: Payer: BLUE CROSS/BLUE SHIELD

## 2017-09-17 ENCOUNTER — Encounter: Payer: Self-pay | Admitting: Internal Medicine

## 2017-09-17 DIAGNOSIS — R300 Dysuria: Secondary | ICD-10-CM

## 2017-09-19 ENCOUNTER — Other Ambulatory Visit: Payer: Self-pay | Admitting: Internal Medicine

## 2017-09-20 ENCOUNTER — Ambulatory Visit
Admission: RE | Admit: 2017-09-20 | Discharge: 2017-09-20 | Disposition: A | Discharge status: Home / Self Care | Payer: BLUE CROSS/BLUE SHIELD | Source: Ambulatory Visit | Attending: Internal Medicine | Admitting: Internal Medicine

## 2017-09-20 DIAGNOSIS — N6320 Unspecified lump in the left breast, unspecified quadrant: Secondary | ICD-10-CM

## 2017-09-20 NOTE — Telephone Encounter (Signed)
Pt is aware that she needs to pick up lab orders to take with her to [facility].

## 2017-09-24 ENCOUNTER — Other Ambulatory Visit: Payer: Self-pay | Admitting: Internal Medicine

## 2017-09-25 LAB — COMPREHENSIVE METABOLIC PANEL
A/G RATIO: 1.8 (ref 1.2–2.2)
ALT: 38 IU/L — AB (ref 0–32)
AST: 28 IU/L (ref 0–40)
Albumin: 4.3 g/dL (ref 3.5–5.5)
Alkaline Phosphatase: 84 IU/L (ref 39–117)
BUN/Creatinine Ratio: 11 (ref 9–23)
BUN: 10 mg/dL (ref 6–24)
Bilirubin Total: 0.2 mg/dL (ref 0.0–1.2)
CALCIUM: 9.1 mg/dL (ref 8.7–10.2)
CO2: 23 mmol/L (ref 20–29)
Chloride: 105 mmol/L (ref 96–106)
Creatinine, Ser: 0.94 mg/dL (ref 0.57–1.00)
GFR calc Af Amer: 78 mL/min/{1.73_m2} (ref >59)
GFR, EST NON AFRICAN AMERICAN: 68 mL/min/{1.73_m2} (ref >59)
GLOBULIN, TOTAL: 2.4 g/dL (ref 1.5–4.5)
Glucose: 95 mg/dL (ref 65–99)
POTASSIUM: 4.3 mmol/L (ref 3.5–5.2)
SODIUM: 141 mmol/L (ref 134–144)
Total Protein: 6.7 g/dL (ref 6.0–8.5)

## 2017-09-25 LAB — CBC WITH DIFFERENTIAL/PLATELET
Basophils Absolute: 0 10*3/uL (ref 0.0–0.2)
Basos: 1 %
EOS (ABSOLUTE): 0.1 10*3/uL (ref 0.0–0.4)
Eos: 1 %
Hematocrit: 40.3 % (ref 34.0–46.6)
Hemoglobin: 14 g/dL (ref 11.1–15.9)
IMMATURE GRANULOCYTES: 0 %
Immature Grans (Abs): 0 10*3/uL (ref 0.0–0.1)
LYMPHS ABS: 1.8 10*3/uL (ref 0.7–3.1)
Lymphs: 37 %
MCH: 31.5 pg (ref 26.6–33.0)
MCHC: 34.7 g/dL (ref 31.5–35.7)
MCV: 91 fL (ref 79–97)
MONOS ABS: 0.4 10*3/uL (ref 0.1–0.9)
Monocytes: 9 %
NEUTROS PCT: 52 %
Neutrophils Absolute: 2.6 10*3/uL (ref 1.4–7.0)
PLATELETS: 267 10*3/uL (ref 150–379)
RBC: 4.45 x10E6/uL (ref 3.77–5.28)
RDW: 13.4 % (ref 12.3–15.4)
WBC: 4.9 10*3/uL (ref 3.4–10.8)

## 2017-09-25 LAB — URINALYSIS, ROUTINE W REFLEX MICROSCOPIC
BILIRUBIN UA: NEGATIVE
Glucose, UA: NEGATIVE
KETONES UA: NEGATIVE
LEUKOCYTES UA: NEGATIVE
Nitrite, UA: NEGATIVE
PH UA: 6.5 (ref 5.0–7.5)
PROTEIN UA: NEGATIVE
RBC UA: NEGATIVE
UUROB: 0.2 mg/dL (ref 0.2–1.0)

## 2017-09-25 LAB — LIPID PANEL
CHOLESTEROL TOTAL: 217 mg/dL — AB (ref 100–199)
Chol/HDL Ratio: 3.6 ratio (ref 0.0–4.4)
HDL: 61 mg/dL (ref >39)
LDL Calculated: 137 mg/dL — ABNORMAL HIGH (ref 0–99)
Triglycerides: 96 mg/dL (ref 0–149)
VLDL CHOLESTEROL CAL: 19 mg/dL (ref 5–40)

## 2017-09-25 LAB — T4 AND TSH
T4 TOTAL: 6.6 ug/dL (ref 4.5–12.0)
TSH: 4 u[IU]/mL (ref 0.450–4.500)

## 2017-09-25 LAB — VITAMIN D 25 HYDROXY (VIT D DEFICIENCY, FRACTURES): VIT D 25 HYDROXY: 11.9 ng/mL — AB (ref 30.0–100.0)

## 2017-09-26 ENCOUNTER — Encounter: Payer: Self-pay | Admitting: Internal Medicine

## 2017-09-27 ENCOUNTER — Encounter: Payer: Self-pay | Admitting: Internal Medicine

## 2017-09-27 ENCOUNTER — Ambulatory Visit (INDEPENDENT_AMBULATORY_CARE_PROVIDER_SITE_OTHER): Payer: BLUE CROSS/BLUE SHIELD | Admitting: Internal Medicine

## 2017-09-27 ENCOUNTER — Other Ambulatory Visit: Payer: Self-pay | Admitting: Internal Medicine

## 2017-09-27 VITALS — BP 130/76 | HR 94 | Temp 97.8°F | Resp 15 | Ht 63.0 in | Wt 199.2 lb

## 2017-09-27 DIAGNOSIS — R748 Abnormal levels of other serum enzymes: Secondary | ICD-10-CM

## 2017-09-27 DIAGNOSIS — R740 Nonspecific elevation of levels of transaminase and lactic acid dehydrogenase [LDH]: Secondary | ICD-10-CM | POA: Diagnosis not present

## 2017-09-27 DIAGNOSIS — Z0000 Encounter for general adult medical examination without abnormal findings: Secondary | ICD-10-CM | POA: Diagnosis not present

## 2017-09-27 DIAGNOSIS — Z124 Encounter for screening for malignant neoplasm of cervix: Secondary | ICD-10-CM | POA: Diagnosis not present

## 2017-09-27 DIAGNOSIS — E559 Vitamin D deficiency, unspecified: Secondary | ICD-10-CM

## 2017-09-27 DIAGNOSIS — R7401 Elevation of levels of liver transaminase levels: Secondary | ICD-10-CM

## 2017-09-27 DIAGNOSIS — E034 Atrophy of thyroid (acquired): Secondary | ICD-10-CM | POA: Diagnosis not present

## 2017-09-27 DIAGNOSIS — N6002 Solitary cyst of left breast: Secondary | ICD-10-CM

## 2017-09-27 DIAGNOSIS — D2372 Other benign neoplasm of skin of left lower limb, including hip: Secondary | ICD-10-CM

## 2017-09-27 DIAGNOSIS — R928 Other abnormal and inconclusive findings on diagnostic imaging of breast: Secondary | ICD-10-CM

## 2017-09-27 DIAGNOSIS — Z1211 Encounter for screening for malignant neoplasm of colon: Secondary | ICD-10-CM

## 2017-09-27 MED ORDER — ERGOCALCIFEROL 1.25 MG (50000 UT) PO CAPS: 50000.0000 [IU] | ORAL_CAPSULE | ORAL | 3 refills | Status: DC

## 2017-09-27 MED ORDER — TRIAMCINOLONE ACETONIDE 0.1 % EX CREA: 1.0000 "application " | TOPICAL_CREAM | Freq: Two times a day (BID) | CUTANEOUS | 0 refills | Status: DC

## 2017-09-27 NOTE — Patient Instructions (Addendum)
I do NOT feel the cyst vs lymph node that the radiologist is following on your left breast mammogram  I would feel more confident about "doing nothing "  if Dr Byrnett  Agrees after reviewing  the images  With you,  So I will make a referral for that as well as for your colonoscopy  I will also order an ultrasound of your liver to be done in January   Triamcinolone ointment twice daily to itchy bump on leg  I have prescrived Drisdol 50,000 IUs (D3) WEEKLY for  4 months,  Then OTC Vitam in D3 2000 ius daily    Health Maintenance for Postmenopausal Women Menopause is a normal process in which your reproductive ability comes to an end. This process happens gradually over a span of months to years, usually between the ages of 48 and 55. Menopause is complete when you have missed 12 consecutive menstrual periods. It is important to talk with your health care provider about some of the most common conditions that affect postmenopausal women, such as heart disease, cancer, and bone loss (osteoporosis). Adopting a healthy lifestyle and getting preventive care can help to promote your health and wellness. Those actions can also lower your chances of developing some of these common conditions. What should I know about menopause? During menopause, you may experience a number of symptoms, such as:  Moderate-to-severe hot flashes.  Night sweats.  Decrease in sex drive.  Mood swings.  Headaches.  Tiredness.  Irritability.  Memory problems.  Insomnia.  Choosing to treat or not to treat menopausal changes is an individual decision that you make with your health care provider. What should I know about hormone replacement therapy and supplements? Hormone therapy products are effective for treating symptoms that are associated with menopause, such as hot flashes and night sweats. Hormone replacement carries certain risks, especially as you become older. If you are thinking about using estrogen or  estrogen with progestin treatments, discuss the benefits and risks with your health care provider. What should I know about heart disease and stroke? Heart disease, heart attack, and stroke become more likely as you age. This may be due, in part, to the hormonal changes that your body experiences during menopause. These can affect how your body processes dietary fats, triglycerides, and cholesterol. Heart attack and stroke are both medical emergencies. There are many things that you can do to help prevent heart disease and stroke:  Have your blood pressure checked at least every 1-2 years. High blood pressure causes heart disease and increases the risk of stroke.  If you are 55-79 years old, ask your health care provider if you should take aspirin to prevent a heart attack or a stroke.  Do not use any tobacco products, including cigarettes, chewing tobacco, or electronic cigarettes. If you need help quitting, ask your health care provider.  It is important to eat a healthy diet and maintain a healthy weight. ? Be sure to include plenty of vegetables, fruits, low-fat dairy products, and lean protein. ? Avoid eating foods that are high in solid fats, added sugars, or salt (sodium).  Get regular exercise. This is one of the most important things that you can do for your health. ? Try to exercise for at least 150 minutes each week. The type of exercise that you do should increase your heart rate and make you sweat. This is known as moderate-intensity exercise. ? Try to do strengthening exercises at least twice each week. Do these in   addition to the moderate-intensity exercise.  Know your numbers.Ask your health care provider to check your cholesterol and your blood glucose. Continue to have your blood tested as directed by your health care provider.  What should I know about cancer screening? There are several types of cancer. Take the following steps to reduce your risk and to catch any cancer  development as early as possible. Breast Cancer  Practice breast self-awareness. ? This means understanding how your breasts normally appear and feel. ? It also means doing regular breast self-exams. Let your health care provider know about any changes, no matter how small.  If you are 40 or older, have a clinician do a breast exam (clinical breast exam or CBE) every year. Depending on your age, family history, and medical history, it may be recommended that you also have a yearly breast X-ray (mammogram).  If you have a family history of breast cancer, talk with your health care provider about genetic screening.  If you are at high risk for breast cancer, talk with your health care provider about having an MRI and a mammogram every year.  Breast cancer (BRCA) gene test is recommended for women who have family members with BRCA-related cancers. Results of the assessment will determine the need for genetic counseling and BRCA1 and for BRCA2 testing. BRCA-related cancers include these types: ? Breast. This occurs in males or females. ? Ovarian. ? Tubal. This may also be called fallopian tube cancer. ? Cancer of the abdominal or pelvic lining (peritoneal cancer). ? Prostate. ? Pancreatic.  Cervical, Uterine, and Ovarian Cancer Your health care provider may recommend that you be screened regularly for cancer of the pelvic organs. These include your ovaries, uterus, and vagina. This screening involves a pelvic exam, which includes checking for microscopic changes to the surface of your cervix (Pap test).  For women ages 21-65, health care providers may recommend a pelvic exam and a Pap test every three years. For women ages 30-65, they may recommend the Pap test and pelvic exam, combined with testing for human papilloma virus (HPV), every five years. Some types of HPV increase your risk of cervical cancer. Testing for HPV may also be done on women of any age who have unclear Pap test  results.  Other health care providers may not recommend any screening for nonpregnant women who are considered low risk for pelvic cancer and have no symptoms. Ask your health care provider if a screening pelvic exam is right for you.  If you have had past treatment for cervical cancer or a condition that could lead to cancer, you need Pap tests and screening for cancer for at least 20 years after your treatment. If Pap tests have been discontinued for you, your risk factors (such as having a new sexual partner) need to be reassessed to determine if you should start having screenings again. Some women have medical problems that increase the chance of getting cervical cancer. In these cases, your health care provider may recommend that you have screening and Pap tests more often.  If you have a family history of uterine cancer or ovarian cancer, talk with your health care provider about genetic screening.  If you have vaginal bleeding after reaching menopause, tell your health care provider.  There are currently no reliable tests available to screen for ovarian cancer.  Lung Cancer Lung cancer screening is recommended for adults 55-80 years old who are at high risk for lung cancer because of a history of smoking. A   yearly low-dose CT scan of the lungs is recommended if you:  Currently smoke.  Have a history of at least 30 pack-years of smoking and you currently smoke or have quit within the past 15 years. A pack-year is smoking an average of one pack of cigarettes per day for one year.  Yearly screening should:  Continue until it has been 15 years since you quit.  Stop if you develop a health problem that would prevent you from having lung cancer treatment.  Colorectal Cancer  This type of cancer can be detected and can often be prevented.  Routine colorectal cancer screening usually begins at age 50 and continues through age 75.  If you have risk factors for colon cancer, your health  care provider may recommend that you be screened at an earlier age.  If you have a family history of colorectal cancer, talk with your health care provider about genetic screening.  Your health care provider may also recommend using home test kits to check for hidden blood in your stool.  A small camera at the end of a tube can be used to examine your colon directly (sigmoidoscopy or colonoscopy). This is done to check for the earliest forms of colorectal cancer.  Direct examination of the colon should be repeated every 5-10 years until age 75. However, if early forms of precancerous polyps or small growths are found or if you have a family history or genetic risk for colorectal cancer, you may need to be screened more often.  Skin Cancer  Check your skin from head to toe regularly.  Monitor any moles. Be sure to tell your health care provider: ? About any new moles or changes in moles, especially if there is a change in a mole's shape or color. ? If you have a mole that is larger than the size of a pencil eraser.  If any of your family members has a history of skin cancer, especially at a young age, talk with your health care provider about genetic screening.  Always use sunscreen. Apply sunscreen liberally and repeatedly throughout the day.  Whenever you are outside, protect yourself by wearing long sleeves, pants, a wide-brimmed hat, and sunglasses.  What should I know about osteoporosis? Osteoporosis is a condition in which bone destruction happens more quickly than new bone creation. After menopause, you may be at an increased risk for osteoporosis. To help prevent osteoporosis or the bone fractures that can happen because of osteoporosis, the following is recommended:  If you are 19-50 years old, get at least 1,000 mg of calcium and at least 600 mg of vitamin D per day.  If you are older than age 50 but younger than age 70, get at least 1,200 mg of calcium and at least 600 mg of  vitamin D per day.  If you are older than age 70, get at least 1,200 mg of calcium and at least 800 mg of vitamin D per day.  Smoking and excessive alcohol intake increase the risk of osteoporosis. Eat foods that are rich in calcium and vitamin D, and do weight-bearing exercises several times each week as directed by your health care provider. What should I know about how menopause affects my mental health? Depression may occur at any age, but it is more common as you become older. Common symptoms of depression include:  Low or sad mood.  Changes in sleep patterns.  Changes in appetite or eating patterns.  Feeling an overall lack of motivation or   enjoyment of activities that you previously enjoyed.  Frequent crying spells.  Talk with your health care provider if you think that you are experiencing depression. What should I know about immunizations? It is important that you get and maintain your immunizations. These include:  Tetanus, diphtheria, and pertussis (Tdap) booster vaccine.  Influenza every year before the flu season begins.  Pneumonia vaccine.  Shingles vaccine.  Your health care provider may also recommend other immunizations. This information is not intended to replace advice given to you by your health care provider. Make sure you discuss any questions you have with your health care provider. Document Released: 12/28/2005 Document Revised: 05/25/2016 Document Reviewed: 08/09/2015 Elsevier Interactive Patient Education  2018 [employer].

## 2017-09-27 NOTE — Progress Notes (Signed)
Patient ID: Marie Howard, female    DOB: [birth year]  Age: 56 y.o. MRN: [id number]  The patient is here for annual PREVENTIVE examination and management of other chronic and acute problems  NEEDS PAP MAMMOGRAM DONE . PERSISTENT BREAST CYST on left.  No previous colonoscopy.    The risk factors are reflected in the social history.  The roster of all physicians providing medical care to patient - is listed in the Snapshot section of the chart.  Activities of daily living:  The patient is 100% independent in all ADLs: dressing, toileting, feeding as well as independent mobility  Home safety : The patient has smoke detectors in the home. They wear seatbelts.  There are no firearms at home. There is no violence in the home.   There is no risks for hepatitis, STDs or HIV. There is no   history of blood transfusion. They have no travel history to infectious disease endemic areas of the world.  The patient has seen their dentist in the last six month. They have seen their eye doctor in the last year. They admit to slight hearing difficulty with regard to whispered voices and some television programs.  They have deferred audiologic testing in the last year.  They do not  have excessive sun exposure. Discussed the need for sun protection: hats, long sleeves and use of sunscreen if there is significant sun exposure.   Diet: the importance of a healthy diet is discussed. They do have a healthy diet.  The benefits of regular aerobic exercise were discussed. She walks 4 times per week ,  20 minutes.   Depression screen: there are no signs or vegative symptoms of depression- irritability, change in appetite, anhedonia, sadness/tearfullness.  Cognitive assessment: the patient manages all their financial and personal affairs and is actively engaged. They could relate day,date,year and events; recalled 2/3 objects at 3 minutes; performed clock-face test normally.  The following portions of the patient's  history were reviewed and updated as appropriate: allergies, current medications, past family history, past medical history,  past surgical history, past social history  and problem list.  Visual acuity was not assessed per patient preference since she has regular follow up with her ophthalmologist. Hearing and body mass index were assessed and reviewed.   During the course of the visit the patient was educated and counseled about appropriate screening and preventive services including : fall prevention , diabetes screening, nutrition counseling, colorectal cancer screening, and recommended immunizations.    CC: The primary encounter diagnosis was Cervical cancer screening. Diagnoses of Abnormal mammogram of left breast, Colon cancer screening, Elevated ALT measurement, Special screening for malignant neoplasms, colon, Breast cyst, left, Elevated liver enzymes, Vitamin D deficiency, Benign neoplasm of skin of left lower extremity, Encounter for preventive health examination, and Hypothyroidism due to acquired atrophy of thyroid were also pertinent to this visit.   LABS DONE 3 DAYS AGO AND REVIEWED WITH PATIENT:  CHOLESTEROL IMPROVED  bY 20 PTS ON LDL VITAMIN  D STILL VERY LOW TSH NORMAL , UA NORMAL  ALT STILL SLIGHTLY ELEVATED  AT 40 STABLE   DISCUSSED REFERRAL TO [facility]T FOR 2ND OPINION ON bREAST CYST  AND FOR COLONOSCOPY   HAS REFERRED PAIN TO LEFT SHOULDER WITH LARGE MEALS    History Sharen has a past medical history of Arthritis, GERD (gastroesophageal reflux disease), History of chicken pox, and Hyperlipidemia.   She has no past surgical history on file.   Her family history includes Arthritis in her  father and mother; Cancer in her paternal grandfather; Heart disease in her paternal grandmother; Heart disease (age of onset: 33) in her father; Hyperlipidemia in her father, mother, and paternal grandmother; Hypertension in her father and mother; Stroke in her paternal grandmother.She  reports that  has never smoked. she has never used smokeless tobacco. She reports that she drinks alcohol. She reports that she does not use drugs.  Outpatient Medications Prior to Visit  Medication Sig Dispense Refill  . ergocalciferol (DRISDOL) 50000 units capsule Take 1 capsule (50,000 Units total) by mouth once a week. (Patient not taking: Reported on 09/27/2017) 12 capsule 2  . OVER THE COUNTER MEDICATION Take 1 tablet by mouth daily. Airborne vitamin c    . Probiotic Product (PROBIOTIC ADVANCED PO) Take 1 tablet by mouth daily.     No facility-administered medications prior to visit.     Review of Systems   Patient denies headache, fevers, malaise, unintentional weight loss, skin rash, eye pain, sinus congestion and sinus pain, sore throat, dysphagia,  hemoptysis , cough, dyspnea, wheezing, chest pain, palpitations, orthopnea, edema, abdominal pain, nausea, melena, diarrhea, constipation, flank pain, dysuria, hematuria, urinary  Frequency, nocturia, numbness, tingling, seizures,  Focal weakness, Loss of consciousness,  Tremor, insomnia, depression, anxiety, and suicidal ideation.     Objective:  BP 130/76 (BP Location: Left Arm, Patient Position: Sitting, Cuff Size: Normal)   Pulse 94   Temp 97.8 F (36.6 C) (Oral)   Resp 15   Ht 5\' 3"  (1.6 m)   Wt 199 lb 3.2 oz (90.4 kg)   SpO2 98%   BMI 35.29 kg/m   Physical Exam   General Appearance:    Alert, cooperative, no distress, appears stated age  Head:    Normocephalic, without obvious abnormality, atraumatic  Eyes:    PERRL, conjunctiva/corneas clear, EOM's intact, fundi    benign, both eyes  Ears:    Normal TM's and external ear canals, both ears  Nose:   Nares normal, septum midline, mucosa normal, no drainage    or sinus tenderness  Throat:   Lips, mucosa, and tongue normal; teeth and gums normal  Neck:   Supple, symmetrical, trachea midline, no adenopathy;    thyroid:  no enlargement/tenderness/nodules; no carotid   bruit  or JVD  Back:     Symmetric, no curvature, ROM normal, no CVA tenderness  Lungs:     Clear to auscultation bilaterally, respirations unlabored  Chest Wall:    No tenderness or deformity   Heart:    Regular rate and rhythm, S1 and S2 normal, no murmur, rub   or gallop  Breast Exam:    No tenderness, masses, or nipple abnormality  Abdomen:     Soft, non-tender, bowel sounds active all four quadrants,    no masses, no organomegaly  Genitalia:    Pelvic: cervix normal in appearance, external genitalia normal, no adnexal masses or tenderness, no cervical motion tenderness, rectovaginal septum normal, uterus normal size, shape, and consistency and vagina normal without discharge  Extremities:   Extremities normal, atraumatic, no cyanosis or edema  Pulses:   2+ and symmetric all extremities  Skin:   LLE: 5 mm scaling papule.    Lymph nodes:   Cervical, supraclavicular, and axillary nodes normal  Neurologic:   CNII-XII intact, normal strength, sensation and reflexes    throughout    Assessment & Plan:   Problem List Items Addressed This Visit    Breast cyst, left    Persistent by repeat  mammogram.  Referral to Dr Bary Castilla for second opinion on whether annual surveillance is appropriate.       Elevated liver enzymes    sHe has had mild elevation of ALT for over a year and reports post prandial left shoulder pain .  ultrasound of liver ordered.       Encounter for preventive health examination    Annual comprehensive preventive exam was done as well as an evaluation and management of chronic conditions .  During the course of the visit the patient was educated and counseled about appropriate screening and preventive services including :  diabetes screening, lipid analysis with projected  10 year  risk for CAD , nutrition counseling, breast, cervical and colorectal cancer screening, and recommended immunizations.  Printed recommendations for health maintenance screenings was given.       Hypothyroidism due to acquired atrophy of thyroid    Thyroid function is WNL  without medication   Lab Results  Component Value Date   TSH 4.000 09/24/2017         Skin benign neoplasm    Persistent > 6 months,  Itchy,  Seen by dermatology, not consdered malignant. triacminolone bid trial       Special screening for malignant neoplasms, colon    Referral for colonoscopy in progress with Dr Bary Castilla      Vitamin D deficiency    Resuming weekly megadose        Other Visit Diagnoses    Cervical cancer screening    -  Primary   Relevant Orders   Cytology - PAP   Abnormal mammogram of left breast       Relevant Orders   Ambulatory referral to General Surgery   Colon cancer screening       Relevant Orders   Ambulatory referral to General Surgery   Elevated ALT measurement       Relevant Orders   US Abdomen Limited RUQ      I have discontinued Lynnley W. Veney's OVER THE COUNTER MEDICATION and Probiotic Product (PROBIOTIC ADVANCED PO). I have also changed her ergocalciferol. Additionally, I am having her start on triamcinolone cream.  Meds ordered this encounter  Medications  . ergocalciferol (DRISDOL) 50000 units capsule    Sig: Take 1 capsule (50,000 Units total) once a week by mouth.    Dispense:  4 capsule    Refill:  3  . triamcinolone cream (KENALOG) 0.1 %    Sig: Apply 1 application 2 (two) times daily topically.    Dispense:  30 g    Refill:  0    Medications Discontinued During This Encounter  Medication Reason  . ergocalciferol (DRISDOL) 50000 units capsule Completed Course  . OVER THE COUNTER MEDICATION Patient has not taken in last 30 days  . Probiotic Product (PROBIOTIC ADVANCED PO) Patient has not taken in last 30 days    Follow-up: No Follow-up on file.   Crecencio Mc, MD

## 2017-09-29 ENCOUNTER — Encounter: Payer: Self-pay | Admitting: Internal Medicine

## 2017-09-29 DIAGNOSIS — D239 Other benign neoplasm of skin, unspecified: Secondary | ICD-10-CM | POA: Insufficient documentation

## 2017-09-29 NOTE — Assessment & Plan Note (Signed)
Persistent > 6 months,  Itchy,  Seen by dermatology, not consdered malignant. triacminolone bid trial

## 2017-09-29 NOTE — Assessment & Plan Note (Signed)
sHe has had mild elevation of ALT for over a year and reports post prandial left shoulder pain .  ultrasound of liver ordered.

## 2017-09-29 NOTE — Assessment & Plan Note (Signed)
Referral for colonoscopy in progress with Dr Bary Castilla

## 2017-09-29 NOTE — Assessment & Plan Note (Signed)
Resuming weekly megadose

## 2017-09-29 NOTE — Assessment & Plan Note (Signed)
Persistent by repeat mammogram.  Referral to Dr Bary Castilla for second opinion on whether annual surveillance is appropriate.

## 2017-09-29 NOTE — Assessment & Plan Note (Signed)
Annual comprehensive preventive exam was done as well as an evaluation and management of chronic conditions .  During the course of the visit the patient was educated and counseled about appropriate screening and preventive services including :  diabetes screening, lipid analysis with projected  10 year  risk for CAD , nutrition counseling, breast, cervical and colorectal cancer screening, and recommended immunizations.  Printed recommendations for health maintenance screenings was given 

## 2017-09-29 NOTE — Assessment & Plan Note (Addendum)
Thyroid function is WNL  without medication   Lab Results  Component Value Date   TSH 4.000 09/24/2017

## 2017-09-30 ENCOUNTER — Telehealth: Payer: Self-pay | Admitting: General Surgery

## 2017-09-30 NOTE — Telephone Encounter (Signed)
I HAVE CALLED PATIENT AND LEFT A MESSAGE TO SCHEDULE A SECOND OPINION WITH DR BYRNETT BY DR Derrel Nip FOR PERSISTENT CYST VS LN LT BR PER [facility] .HAD MAMMO & U/S @ [facility] CAT 3.ALSOFOR A SCREENING COLONOSCOPY(HAS CANCELLED TWICE FOR THIS IN PAST 10-24 & 09-19-16/MTH)

## 2017-10-02 ENCOUNTER — Encounter: Payer: Self-pay | Admitting: Internal Medicine

## 2017-10-05 LAB — PAP LB AND HPV HIGH-RISK: PAP SMEAR COMMENT: 0

## 2017-10-05 LAB — HPV, LOW VOLUME (REFLEX): HPV, LOW VOL REFLEX: NEGATIVE

## 2017-10-08 ENCOUNTER — Encounter: Payer: Self-pay | Admitting: Internal Medicine

## 2017-10-09 ENCOUNTER — Ambulatory Visit: Payer: BLUE CROSS/BLUE SHIELD

## 2017-12-25 ENCOUNTER — Ambulatory Visit
Admission: RE | Admit: 2017-12-25 | Discharge: 2017-12-25 | Disposition: A | Discharge status: Home / Self Care | Payer: BLUE CROSS/BLUE SHIELD | Source: Ambulatory Visit | Attending: Internal Medicine | Admitting: Internal Medicine

## 2017-12-25 DIAGNOSIS — R740 Nonspecific elevation of levels of transaminase and lactic acid dehydrogenase [LDH]: Secondary | ICD-10-CM | POA: Diagnosis not present

## 2017-12-25 DIAGNOSIS — R7401 Elevation of levels of liver transaminase levels: Secondary | ICD-10-CM

## 2017-12-25 DIAGNOSIS — M25512 Pain in left shoulder: Secondary | ICD-10-CM | POA: Diagnosis present

## 2017-12-26 ENCOUNTER — Encounter: Payer: Self-pay | Admitting: Internal Medicine

## 2018-03-19 ENCOUNTER — Encounter: Payer: Self-pay | Admitting: Internal Medicine

## 2018-03-19 ENCOUNTER — Ambulatory Visit (INDEPENDENT_AMBULATORY_CARE_PROVIDER_SITE_OTHER): Payer: BLUE CROSS/BLUE SHIELD | Admitting: Internal Medicine

## 2018-03-19 DIAGNOSIS — M25561 Pain in right knee: Secondary | ICD-10-CM

## 2018-03-19 DIAGNOSIS — Z6832 Body mass index (BMI) 32.0-32.9, adult: Secondary | ICD-10-CM | POA: Diagnosis not present

## 2018-03-19 DIAGNOSIS — E6609 Other obesity due to excess calories: Secondary | ICD-10-CM

## 2018-03-19 DIAGNOSIS — M25562 Pain in left knee: Secondary | ICD-10-CM

## 2018-03-19 DIAGNOSIS — E559 Vitamin D deficiency, unspecified: Secondary | ICD-10-CM

## 2018-03-19 DIAGNOSIS — K760 Fatty (change of) liver, not elsewhere classified: Secondary | ICD-10-CM | POA: Diagnosis not present

## 2018-03-19 NOTE — Progress Notes (Signed)
Subjective:  Patient ID: MANVIR THORSON, female    DOB: 1961/03/10  Age: 57 y.o. MRN: [id number]  CC: Diagnoses of Hepatic steatosis, Class 1 obesity due to excess calories with serious comorbidity and body mass index (BMI) of 32.0 to 32.9 in adult, Acute pain of both knees, and Vitamin D deficiency were pertinent to this visit.  HPI KRISTAIN FILO presents for follow up ,  Seen n Nov for CPE and underwent ultrasound for evaluation of chronically elevated liver enzymes .  Fatty liver suggested,  Reviewed workup done in 2015 which included serologies to rule out iron deposition,  Autoimmune hepatitis.  She has lost 15 lbs since  Last visit,  She is working out regularly with a Physiological scientist   Multiple questions answered today about the condition , management and prognosis   2)  She has developed Knee pain described as a burning sensation and the proximal anterior portion of knee and extending to the distal end of the quadriceps. It is mild, and these is no history of trauma, redness or swelling.  She is curious of use of Osteo  Bi flex   intermittent fasting discussed  Immunity to Hepatitis A/B ?  Reviewed prior labs. Hep B surface ab was negative   Vit d deficiency after finishing the mega dose  she never started the otc supplement     Outpatient Medications Prior to Visit  Medication Sig Dispense Refill  . triamcinolone cream (KENALOG) 0.1 % Apply 1 application 2 (two) times daily topically. (Patient not taking: Reported on 03/19/2018) 30 g 0  . ergocalciferol (DRISDOL) 50000 units capsule Take 1 capsule (50,000 Units total) once a week by mouth. (Patient not taking: Reported on 03/19/2018) 4 capsule 3   No facility-administered medications prior to visit.     Review of Systems;  Patient denies headache, fevers, malaise, unintentional weight loss, skin rash, eye pain, sinus congestion and sinus pain, sore throat, dysphagia,  hemoptysis , cough, dyspnea, wheezing, chest pain,  palpitations, orthopnea, edema, abdominal pain, nausea, melena, diarrhea, constipation, flank pain, dysuria, hematuria, urinary  Frequency, nocturia, numbness, tingling, seizures,  Focal weakness, Loss of consciousness,  Tremor, insomnia, depression, anxiety, and suicidal ideation.      Objective:  BP 122/88 (BP Location: Left Arm, Patient Position: Sitting, Cuff Size: Normal)   Pulse 68   Temp 97.9 F (36.6 C) (Oral)   Resp 14   Ht 5\' 3"  (1.6 m)   Wt 184 lb 9.6 oz (83.7 kg)   SpO2 98%   BMI 32.70 kg/m   BP Readings from Last 3 Encounters:  03/19/18 122/88  09/27/17 130/76  12/11/16 110/70    Wt Readings from Last 3 Encounters:  03/19/18 184 lb 9.6 oz (83.7 kg)  09/27/17 199 lb 3.2 oz (90.4 kg)  12/11/16 191 lb (86.6 kg)    General appearance: alert, cooperative and appears stated age Ears: normal TM's and external ear canals both ears Throat: lips, mucosa, and tongue normal; teeth and gums normal Neck: no adenopathy, no carotid bruit, supple, symmetrical, trachea midline and thyroid not enlarged, symmetric, no tenderness/mass/nodules Back: symmetric, no curvature. ROM normal. No CVA tenderness. Lungs: clear to auscultation bilaterally Heart: regular rate and rhythm, S1, S2 normal, no murmur, click, rub or gallop Abdomen: soft, non-tender; bowel sounds normal; no masses,  no organomegaly Pulses: 2+ and symmetric Skin: Skin color, texture, turgor normal. No rashes or lesions Lymph nodes: Cervical, supraclavicular, and axillary nodes normal.  Lab Results  Component Value  Date   HGBA1C 5.4 07/26/2016   HGBA1C 5.8 07/08/2014   HGBA1C 5.8 06/16/2014    Lab Results  Component Value Date   CREATININE 0.94 09/24/2017   CREATININE 0.92 07/26/2016   CREATININE 0.9 07/08/2014    Lab Results  Component Value Date   WBC 4.9 09/24/2017   HGB 14.0 09/24/2017   HCT 40.3 09/24/2017   PLT 267 09/24/2017   GLUCOSE 95 09/24/2017   CHOL 217 (H) 09/24/2017   TRIG 96 09/24/2017    HDL 61 09/24/2017   LDLCALC 137 (H) 09/24/2017   ALT 38 (H) 09/24/2017   AST 28 09/24/2017   NA 141 09/24/2017   K 4.3 09/24/2017   CL 105 09/24/2017   CREATININE 0.94 09/24/2017   BUN 10 09/24/2017   CO2 23 09/24/2017   TSH 4.000 09/24/2017   HGBA1C 5.4 07/26/2016    US Abdomen Limited Ruq  Result Date: 12/25/2017 CLINICAL DATA:  Elevated liver function tests EXAM: ULTRASOUND ABDOMEN LIMITED RIGHT UPPER QUADRANT COMPARISON:  None. FINDINGS: Gallbladder: The gallbladder is visualized and no gallstones are noted. There is no pain over the gallbladder with compression. Common bile duct: Diameter: The common bile duct is normal measuring 1.4 mm in diameter. Liver: The parenchyma of the liver is very echogenic and inhomogeneous consistent with fatty infiltration. No focal hepatic abnormality is seen. Portal vein is patent on color Doppler imaging with normal direction of blood flow towards the liver. IMPRESSION: 1. Echogenic inhomogeneous liver parenchyma consistent with diffuse fatty infiltration. No focal abnormality. 2. No gallstones. Electronically Signed   By: Ivar Drape M.D.   On: 12/25/2017 14:21    Assessment & Plan:   Problem List Items Addressed This Visit    Vitamin D deficiency    Managed with weekly megadose ; repeat level ordered.  continue 2000 Ius daily until then       Obesity    I have congratulated her in reduction of   BMI and encouraged  Continued weight loss with goal of 10% of body weigh over the next 6 months using a low glycemic index diet and regular exercise a minimum of 5 days per week.        Knee pain, bilateral    History and exam suggest bursitis.  Ice ad NSAIDs advised along with modification of current exercise routine       Hepatic steatosis    Presumed by ultrasound changes and serologies negative for autoimmune causes of hepatitis.  Current liver enzymes are normal and all modifiable risk factors including obesity, and hyperlipidemia have been  addressed   Lab Results  Component Value Date   ALT 38 (H) 09/24/2017   AST 28 09/24/2017   ALKPHOS 84 09/24/2017   BILITOT 0.2 09/24/2017           A total of 25 minutes of face to face time was spent with patient more than half of which was spent in counselling about the above mentioned conditions  and coordination of care  I have discontinued Hadiya Spoerl. Chisum's ergocalciferol. I am also having her maintain her triamcinolone cream.  No orders of the defined types were placed in this encounter.   Medications Discontinued During This Encounter  Medication Reason  . ergocalciferol (DRISDOL) 50000 units capsule Completed Course    Follow-up: Return in about 6 months (around 09/19/2018).   Crecencio Mc, MD

## 2018-03-19 NOTE — Patient Instructions (Addendum)
Start taking 2000 Ius of D3 daily   First goal  Get your weight 165 lbs (losing  19 lbs over 4 months )   Low carb Breakfast options:  Premier Protein  Atkins Advantage Muscle Milk EAS AdvantEdge   All of these are available at [employer], [employer],  [employer], and [employer]  And taste good   Danton Clap "D'Light" frozen entrees:    Frittata  : muffin shaped quiches that contain eggs + cheese + veggies+ meat (no bread) Egg'wich :  [place] sausage patty served on a biscuit made of frittat (no bread)   Both can be microwaved in 2 minutes   Truett Perna:  Just Crack n Egg: yogurt sized cup ith diced ham, cheese and potatoes: add one egg and microwave for 2 minutes   For lunch:   [employer] bagged salads:  Try  the Asian, the SouthWestern,  And the Caesar salads, complete with dressings, nuts and croutons .  Found  in the bagged salad section   Just add a protein (tuna,  Chicken etc ) and omit the won ton [employer] strips  tyr the [employer] dressing  ([employer])    There are plenty of high protein low carb cookies,  But they're not called "cookies."  Look for them in the diet section  where the protein shakes are  Sold.   All of these have 5 g sugar or less : Power crunch Atkins bars KIND :the  "low glycemic index"  variety QUEST : (taste better after being microwaved OUT OF THE WRAPPER)    Check with insurance  About coverage for th  Hepatitis  A and B Vaccines ( Twinrix if combined)  Which are recommended for patients with fatty liver

## 2018-03-22 DIAGNOSIS — M25561 Pain in right knee: Secondary | ICD-10-CM | POA: Insufficient documentation

## 2018-03-22 DIAGNOSIS — M25562 Pain in left knee: Secondary | ICD-10-CM

## 2018-03-22 NOTE — Assessment & Plan Note (Signed)
Presumed by ultrasound changes and serologies negative for autoimmune causes of hepatitis.  Current liver enzymes are normal and all modifiable risk factors including obesity, and hyperlipidemia have been addressed   Lab Results  Component Value Date   ALT 38 (H) 09/24/2017   AST 28 09/24/2017   ALKPHOS 84 09/24/2017   BILITOT 0.2 09/24/2017

## 2018-03-22 NOTE — Assessment & Plan Note (Signed)
History and exam suggest bursitis.  Ice ad NSAIDs advised along with modification of current exercise routine

## 2018-03-22 NOTE — Assessment & Plan Note (Signed)
I have congratulated her in reduction of   BMI and encouraged  Continued weight loss with goal of 10% of body weigh over the next 6 months using a low glycemic index diet and regular exercise a minimum of 5 days per week.    

## 2018-03-22 NOTE — Assessment & Plan Note (Signed)
Managed with weekly megadose ; repeat level ordered.  continue 2000 Ius daily until then

## 2018-05-08 ENCOUNTER — Encounter: Payer: Self-pay | Admitting: *Deleted

## 2018-06-10 ENCOUNTER — Ambulatory Visit (INDEPENDENT_AMBULATORY_CARE_PROVIDER_SITE_OTHER): Payer: BLUE CROSS/BLUE SHIELD | Admitting: General Surgery

## 2018-06-10 ENCOUNTER — Encounter: Payer: Self-pay | Admitting: General Surgery

## 2018-06-10 VITALS — BP 130/70 | HR 81 | Resp 12 | Ht 63.0 in | Wt 184.0 lb

## 2018-06-10 DIAGNOSIS — N6002 Solitary cyst of left breast: Secondary | ICD-10-CM | POA: Diagnosis not present

## 2018-06-10 DIAGNOSIS — Z1211 Encounter for screening for malignant neoplasm of colon: Secondary | ICD-10-CM

## 2018-06-10 MED ORDER — POLYETHYLENE GLYCOL 3350 17 GM/SCOOP PO POWD: 1.0000 | Freq: Once | ORAL | 0 refills | Status: AC

## 2018-06-10 MED ORDER — BISACODYL 5 MG PO TBEC: 5.0000 mg | DELAYED_RELEASE_TABLET | Freq: Once | ORAL | 0 refills | Status: AC

## 2018-06-10 NOTE — Progress Notes (Signed)
2012, 2015 as well as 2017 and 2018 patient ID: Marie Howard, female   DOB: 1961-06-13, 57 y.o.   MRN: [id number]  Chief Complaint  Patient presents with  . Colonoscopy    HPI Marie Howard is a 57 y.o. female here today for a evaluation of a screening colonoscopy. Moves her bowels daily. No GI problems at this time. Last mammogram 09/20/2017. On her mammogram for the past two this area showed up. No pain or tenderness.  HPI  Past Medical History:  Diagnosis Date  . Arthritis   . GERD (gastroesophageal reflux disease)   . History of chicken pox   . Hyperlipidemia     History reviewed. No pertinent surgical history.  Family History  Problem Relation Age of Onset  . Arthritis Mother   . Hyperlipidemia Mother   . Hypertension Mother   . Arthritis Father   . Hyperlipidemia Father   . Heart disease Father 37       CAD s/p 2 vessel CABG   . Hypertension Father   . Heart disease Paternal Grandmother   . Hyperlipidemia Paternal Grandmother   . Stroke Paternal Grandmother   . Cancer Paternal Grandfather        colon    Social History Social History   Tobacco Use  . Smoking status: Never Smoker  . Smokeless tobacco: Never Used  Substance Use Topics  . Alcohol use: Yes    Comment: 1 glass of wine or mixed drink a month  . Drug use: No    Allergies  Allergen Reactions  . Macrobid [Nitrofurantoin Monohyd Macro] Swelling    Current Outpatient Medications  Medication Sig Dispense Refill  . cholecalciferol (VITAMIN D) 1000 units tablet Take 1,000 Units by mouth daily.    . Probiotic Product (PROBIOTIC-10 PO) Take by mouth.    . triamcinolone cream (KENALOG) 0.1 % Apply 1 application 2 (two) times daily topically. 30 g 0   No current facility-administered medications for this visit.     Review of Systems Review of Systems  Constitutional: Negative.   Respiratory: Negative.   Cardiovascular: Negative.     Blood pressure 130/70, pulse 81, resp. rate 12, height 5'  3" (1.6 m), weight 184 lb (83.5 kg).  Physical Exam Physical Exam  Constitutional: She is oriented to person, place, and time. She appears well-developed and well-nourished.  Cardiovascular: Normal rate, regular rhythm and normal heart sounds.  Pulmonary/Chest: Effort normal and breath sounds normal.  Neurological: She is alert and oriented to person, place, and time.  Skin: Skin is warm and dry.    Data Reviewed 2012 mammogram showed generally dense breast with no discernible lesions.  2015 mammogram suggested possible defect in the area of present concern.  2017 and 2018 showed a well-defined area without interval change.  Ultrasound did not meet all criteria for a simple cyst but the area did show smooth borders without posterior acoustic shadowing or internal vascular flow.  Likely degenerating cyst.  Assessment    Candidate for screening colonoscopy.  Stable Left breast density likely a degenerating cyst.  Patient will have a follow-up mammogram and ultrasound in November 2019 to complete 2 years of    Plan   Colonoscopy with possible biopsy/polypectomy prn: Information regarding the procedure, including its potential risks and complications (including but not limited to perforation of the bowel, which may require emergency surgery to repair, and bleeding) was verbally given to the patient. Educational information regarding lower intestinal endoscopy was given to the  patient. Written instructions for how to complete the bowel prep using Miralax were provided. The importance of drinking ample fluids to avoid dehydration as a result of the prep emphasized.  Asse the patient will complete mammograms and ultrasound of the breast in November 2019.  This will complete 2 years i of assessment. If stable, no additional imaging would be recommended.  HPI, Physical Exam, Assessment and Plan have been scribed under the direction and in the presence of Hervey Ard, MD. Gaspar Cola,  CMA  I have completed the exam and reviewed the above documentation for accuracy and completeness.  I agree with the above.  Haematologist has been used and any errors in dictation or transcription are unintentional.  Hervey Ard, M.D., F.A.C.S.  The patient is scheduled for a Colonoscopy at [facility] on 07/02/18. They are aware to call the day before to get their arrival time. She will take 2 Dulcolax tabs in the morning and 2 tabs in the evening on 06/30/18.  Miralax prescription has been sent into the patient's pharmacy. The patient is aware of date and instructions. Documented by Caryl-Lyn Otis Brace LPN    Forest Gleason Berenise Hunton 06/11/2018, 9:15 PM

## 2018-06-10 NOTE — Patient Instructions (Addendum)
Colonoscopy, Adult A colonoscopy is an exam to look at the entire large intestine. During the exam, a lubricated, bendable tube is inserted into the anus and then passed into the rectum, colon, and other parts of the large intestine. A colonoscopy is often done as a part of normal colorectal screening or in response to certain symptoms, such as anemia, persistent diarrhea, abdominal pain, and blood in the stool. The exam can help screen for and diagnose medical problems, including:  Tumors.  Polyps.  Inflammation.  Areas of bleeding.  Tell a health care provider about:  Any allergies you have.  All medicines you are taking, including vitamins, herbs, eye drops, creams, and over-the-counter medicines.  Any problems you or family members have had with anesthetic medicines.  Any blood disorders you have.  Any surgeries you have had.  Any medical conditions you have.  Any problems you have had passing stool. What are the risks? Generally, this is a safe procedure. However, problems may occur, including:  Bleeding.  A tear in the intestine.  A reaction to medicines given during the exam.  Infection (rare).  What happens before the procedure? Eating and drinking restrictions Follow instructions from your health care provider about eating and drinking, which may include:  A few days before the procedure - follow a low-fiber diet. Avoid nuts, seeds, dried fruit, raw fruits, and vegetables.  1-3 days before the procedure - follow a clear liquid diet. Drink only clear liquids, such as clear broth or bouillon, black coffee or tea, clear juice, clear soft drinks or sports drinks, gelatin dessert, and popsicles. Avoid any liquids that contain red or purple dye.  On the day of the procedure - do not eat or drink anything during the 2 hours before the procedure, or within the time period that your health care provider recommends.  Bowel prep If you were prescribed an oral bowel prep  to clean out your colon:  Take it as told by your health care provider. Starting the day before your procedure, you will need to drink a large amount of medicated liquid. The liquid will cause you to have multiple loose stools until your stool is almost clear or light green.  If your skin or anus gets irritated from diarrhea, you may use these to relieve the irritation: ? Medicated wipes, such as adult wet wipes with aloe and vitamin E. ? A skin soothing-product like petroleum jelly.  If you vomit while drinking the bowel prep, take a break for up to 60 minutes and then begin the bowel prep again. If vomiting continues and you cannot take the bowel prep without vomiting, call your health care provider.  General instructions  Ask your health care provider about changing or stopping your regular medicines. This is especially important if you are taking diabetes medicines or blood thinners.  Plan to have someone take you home from the hospital or clinic. What happens during the procedure?  An IV tube may be inserted into one of your veins.  You will be given medicine to help you relax (sedative).  To reduce your risk of infection: ? Your health care team will wash or sanitize their hands. ? Your anal area will be washed with soap.  You will be asked to lie on your side with your knees bent.  Your health care provider will lubricate a long, thin, flexible tube. The tube will have a camera and a light on the end.  The tube will be inserted into your   anus.  The tube will be gently eased through your rectum and colon.  Air will be delivered into your colon to keep it open. You may feel some pressure or cramping.  The camera will be used to take images during the procedure.  A small tissue sample may be removed from your body to be examined under a microscope (biopsy). If any potential problems are found, the tissue will be sent to a lab for testing.  If small polyps are found, your  health care provider may remove them and have them checked for cancer cells.  The tube that was inserted into your anus will be slowly removed. The procedure may vary among health care providers and hospitals. What happens after the procedure?  Your blood pressure, heart rate, breathing rate, and blood oxygen level will be monitored until the medicines you were given have worn off.  Do not drive for 24 hours after the exam.  You may have a small amount of blood in your stool.  You may pass gas and have mild abdominal cramping or bloating due to the air that was used to inflate your colon during the exam.  It is up to you to get the results of your procedure. Ask your health care provider, or the department performing the procedure, when your results will be ready. This information is not intended to replace advice given to you by your health care provider. Make sure you discuss any questions you have with your health care provider. Document Released: 11/02/2000 Document Revised: 09/05/2016 Document Reviewed: 01/17/2016 Elsevier Interactive Patient Education  [employer].  The patient is scheduled for a Colonoscopy at [facility] on 07/02/18. They are aware to call the day before to get their arrival time. She will take 2 Dulcolax tabs in the morning and 2 tabs in the evening on 06/30/18. Miralax prescription has been sent into the patient's pharmacy. The patient is aware of date and instructions.

## 2018-06-11 DIAGNOSIS — N6002 Solitary cyst of left breast: Secondary | ICD-10-CM | POA: Insufficient documentation

## 2018-06-13 ENCOUNTER — Other Ambulatory Visit: Payer: Self-pay | Admitting: General Surgery

## 2018-06-13 DIAGNOSIS — Z1211 Encounter for screening for malignant neoplasm of colon: Secondary | ICD-10-CM

## 2018-06-19 ENCOUNTER — Encounter: Payer: Self-pay | Admitting: Internal Medicine

## 2018-06-20 ENCOUNTER — Encounter: Payer: Self-pay | Admitting: Internal Medicine

## 2018-07-02 ENCOUNTER — Ambulatory Visit: Payer: BLUE CROSS/BLUE SHIELD | Admitting: Certified Registered Nurse Anesthetist

## 2018-07-02 ENCOUNTER — Ambulatory Visit
Admission: RE | Admit: 2018-07-02 | Discharge: 2018-07-02 | Disposition: A | Discharge status: Home / Self Care | Payer: BLUE CROSS/BLUE SHIELD | Source: Ambulatory Visit | Attending: General Surgery | Admitting: General Surgery

## 2018-07-02 ENCOUNTER — Encounter
Admission: RE | Disposition: A | Discharge status: Home / Self Care | Payer: Self-pay | Source: Ambulatory Visit | Attending: General Surgery

## 2018-07-02 ENCOUNTER — Encounter: Payer: Self-pay | Admitting: *Deleted

## 2018-07-02 DIAGNOSIS — Z1211 Encounter for screening for malignant neoplasm of colon: Secondary | ICD-10-CM

## 2018-07-02 HISTORY — PX: COLONOSCOPY WITH PROPOFOL: SHX5780

## 2018-07-02 SURGERY — COLONOSCOPY WITH PROPOFOL
Anesthesia: General

## 2018-07-02 MED ORDER — PROPOFOL 10 MG/ML IV BOLUS
INTRAVENOUS | Status: DC | PRN
  Administered 2018-07-02: 70 mg via INTRAVENOUS
  Administered 2018-07-02: 10 mg via INTRAVENOUS
  Administered 2018-07-02: 20 mg via INTRAVENOUS

## 2018-07-02 MED ORDER — SODIUM CHLORIDE 0.9 % IV SOLN
INTRAVENOUS | Status: DC
  Administered 2018-07-02: 1000 mL via INTRAVENOUS

## 2018-07-02 MED ORDER — PROPOFOL 500 MG/50ML IV EMUL
INTRAVENOUS | Status: DC | PRN
  Administered 2018-07-02: 160 ug/kg/min via INTRAVENOUS

## 2018-07-02 MED ORDER — MIDAZOLAM HCL 2 MG/2ML IJ SOLN
INTRAMUSCULAR | Status: DC | PRN
  Administered 2018-07-02: 2 mg via INTRAVENOUS

## 2018-07-02 MED ORDER — MIDAZOLAM HCL 2 MG/2ML IJ SOLN
INTRAMUSCULAR | Status: AC
  Filled 2018-07-02: qty 2

## 2018-07-02 NOTE — Anesthesia Procedure Notes (Signed)
Performed by: Demetrius Charity, CRNA Pre-anesthesia Checklist: Patient identified, Suction available, Emergency Drugs available, Timeout performed and Patient being monitored Patient Re-evaluated:Patient Re-evaluated prior to induction Oxygen Delivery Method: Nasal cannula Induction Type: IV induction

## 2018-07-02 NOTE — Transfer of Care (Signed)
Immediate Anesthesia Transfer of Care Note  Patient: Marie Howard  Procedure(s) Performed: COLONOSCOPY WITH PROPOFOL (N/A )  Patient Location: PACU  Anesthesia Type:General  Level of Consciousness: awake, alert  and oriented  Airway & Oxygen Therapy: Patient Spontanous Breathing and Patient connected to nasal cannula oxygen  Post-op Assessment: Report given to RN and Post -op Vital signs reviewed and stable  Post vital signs: Reviewed and stable  Last Vitals:  Vitals Value Taken Time  BP 102/60 07/02/2018  9:21 AM  Temp    Pulse 78 07/02/2018  9:21 AM  Resp 22 07/02/2018  9:21 AM  SpO2 100 % 07/02/2018  9:21 AM  Vitals shown include unvalidated device data.  Last Pain:  Vitals:   07/02/18 0747  TempSrc: Tympanic  PainSc: 0-No pain         Complications: No apparent anesthesia complications

## 2018-07-02 NOTE — H&P (Signed)
No interval change in clinical history or exam.  For screening colonoscopy.

## 2018-07-02 NOTE — Anesthesia Preprocedure Evaluation (Signed)
Anesthesia Evaluation  Patient identified by MRN, date of birth, ID band Patient awake    Reviewed: Allergy & Precautions, H&P , NPO status , Patient's Chart, lab work & pertinent test results, reviewed documented beta blocker date and time   History of Anesthesia Complications Negative for: history of anesthetic complications  Airway Mallampati: I  TM Distance: >3 FB Neck ROM: full    Dental  (+) Caps, Dental Advidsory Given, Teeth Intact   Pulmonary neg pulmonary ROS,           Cardiovascular Exercise Tolerance: Good negative cardio ROS       Neuro/Psych negative neurological ROS  negative psych ROS   GI/Hepatic Neg liver ROS, GERD  ,  Endo/Other  negative endocrine ROS  Renal/GU negative Renal ROS  negative genitourinary   Musculoskeletal   Abdominal   Peds  Hematology negative hematology ROS (+)   Anesthesia Other Findings Past Medical History: No date: Arthritis No date: GERD (gastroesophageal reflux disease) No date: History of chicken pox No date: Hyperlipidemia   Reproductive/Obstetrics negative OB ROS                             Anesthesia Physical Anesthesia Plan  ASA: II  Anesthesia Plan: General   Post-op Pain Management:    Induction: Intravenous  PONV Risk Score and Plan: 3 and Propofol infusion and TIVA  Airway Management Planned: Nasal Cannula  Additional Equipment:   Intra-op Plan:   Post-operative Plan:   Informed Consent: I have reviewed the patients History and Physical, chart, labs and discussed the procedure including the risks, benefits and alternatives for the proposed anesthesia with the patient or authorized representative who has indicated his/her understanding and acceptance.   Dental Advisory Given  Plan Discussed with: Anesthesiologist, CRNA and Surgeon  Anesthesia Plan Comments:         Anesthesia Quick Evaluation

## 2018-07-02 NOTE — Op Note (Signed)
[facility] Patient Name: Marie Howard Procedure Date: 07/02/2018 8:36 AM MRN: [id number] Account #: [id number] Date of Birth: 18-Oct-1961 Admit Type: Outpatient Age: 57 Room: [facility] ENDO ROOM 1 Gender: Female Note Status: Finalized Procedure:            Colonoscopy Indications:          Screening for colorectal malignant neoplasm Providers:            Robert Bellow, MD Referring MD:         Deborra Medina, MD (Referring MD) Medicines:            Monitored Anesthesia Care Complications:        No immediate complications. Procedure:            Pre-Anesthesia Assessment:                       - Prior to the procedure, a History and Physical was                        performed, and patient medications, allergies and                        sensitivities were reviewed. The patient's tolerance of                        previous anesthesia was reviewed.                       - The risks and benefits of the procedure and the                        sedation options and risks were discussed with the                        patient. All questions were answered and informed                        consent was obtained.                       After obtaining informed consent, the colonoscope was                        passed under direct vision. Throughout the procedure,                        the patient's blood pressure, pulse, and oxygen                        saturations were monitored continuously. The                        Colonoscope was introduced through the anus and                        advanced to the the cecum, identified by appendiceal                        orifice and ileocecal valve. The colonoscopy was  somewhat difficult due to restricted mobility of the                        colon. The patient tolerated the procedure well. The                        quality of the bowel preparation was excellent. Findings:      The  entire examined colon appeared normal on direct and retroflexion       views. Impression:           - The entire examined colon is normal on direct and                        retroflexion views.                       - No specimens collected. Recommendation:       - Discharge patient to home (via wheelchair).                       - Repeat colonoscopy in 10 years for screening purposes. Procedure Code(s):    --- Professional ---                       [phone], Colonoscopy, flexible; diagnostic, including                        collection of specimen(s) by brushing or washing, when                        performed (separate procedure) Diagnosis Code(s):    --- Professional ---                       Z12.11, Encounter for screening for malignant neoplasm                        of colon CPT copyright 2017 [employer]. All rights reserved. The codes documented in this report are preliminary and upon coder review may  be revised to meet current compliance requirements. Robert Bellow, MD 07/02/2018 9:18:50 AM This report has been signed electronically. Number of Addenda: 0 Note Initiated On: 07/02/2018 8:36 AM Scope Withdrawal Time: 0 hours 9 minutes 1 second  Total Procedure Duration: 0 hours 20 minutes 54 seconds       [facility]

## 2018-07-02 NOTE — Anesthesia Post-op Follow-up Note (Signed)
Anesthesia QCDR form completed.        

## 2018-07-02 NOTE — Anesthesia Postprocedure Evaluation (Signed)
Anesthesia Post Note  Patient: Marie Howard  Procedure(s) Performed: COLONOSCOPY WITH PROPOFOL (N/A )  Patient location during evaluation: Endoscopy Anesthesia Type: General Level of consciousness: awake and alert Pain management: pain level controlled Vital Signs Assessment: post-procedure vital signs reviewed and stable Respiratory status: spontaneous breathing, nonlabored ventilation, respiratory function stable and patient connected to nasal cannula oxygen Cardiovascular status: blood pressure returned to baseline and stable Postop Assessment: no apparent nausea or vomiting Anesthetic complications: no     Last Vitals:  Vitals:   07/02/18 0747 07/02/18 0921  BP: (!) 148/97   Pulse: 73   Resp: 16   Temp: (!) 36.1 C (!) 36.2 C  SpO2: 100%     Last Pain:  Vitals:   07/02/18 0921  TempSrc: Tympanic  PainSc:                  [name]

## 2018-07-07 ENCOUNTER — Encounter: Payer: Self-pay | Admitting: General Surgery

## 2018-07-08 ENCOUNTER — Encounter: Payer: Self-pay | Admitting: General Surgery

## 2018-09-19 ENCOUNTER — Ambulatory Visit: Payer: BLUE CROSS/BLUE SHIELD | Admitting: Internal Medicine

## 2018-10-10 ENCOUNTER — Other Ambulatory Visit: Payer: Self-pay | Admitting: Internal Medicine

## 2018-10-10 MED ORDER — PREDNISONE 10 MG PO TABS: ORAL_TABLET | ORAL | 0 refills | Status: DC

## 2018-10-10 NOTE — Progress Notes (Signed)
pred 

## 2018-10-29 IMAGING — MG MM DIGITAL DIAGNOSTIC BILAT W/ TOMO W/ CAD
8 of 13 series · 8 of 29 positions shown · non-contrast
Comparison: Previous exam(s).

CLINICAL DATA: Short-term follow-up left breast mass

EXAM:
2D DIGITAL DIAGNOSTIC BILATERAL MAMMOGRAM WITH CAD AND ADJUNCT TOMO
ULTRASOUND LEFT BREAST

[L MLO]
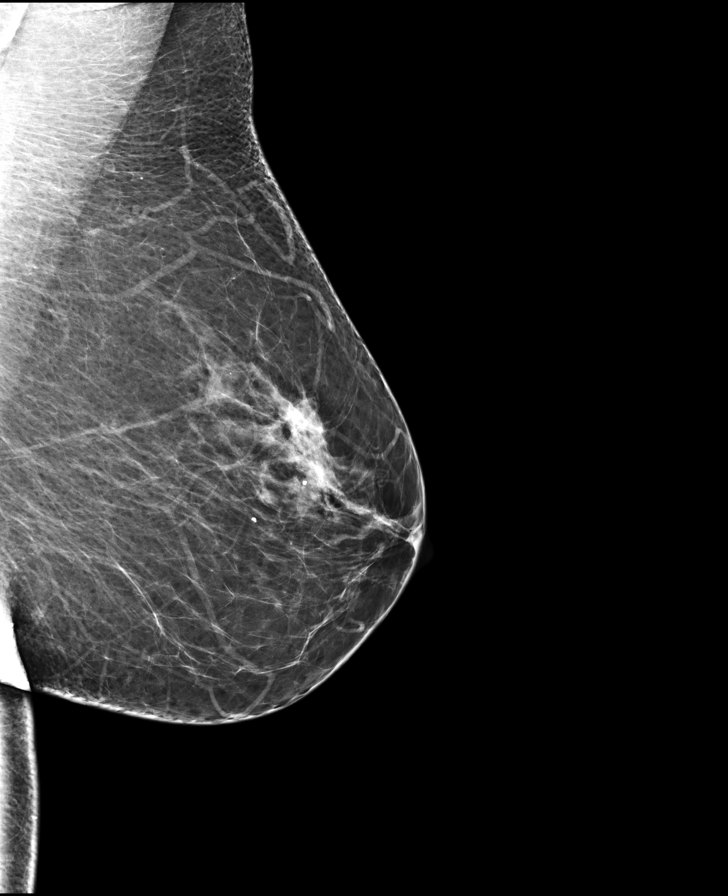

[L CC synth-2D]
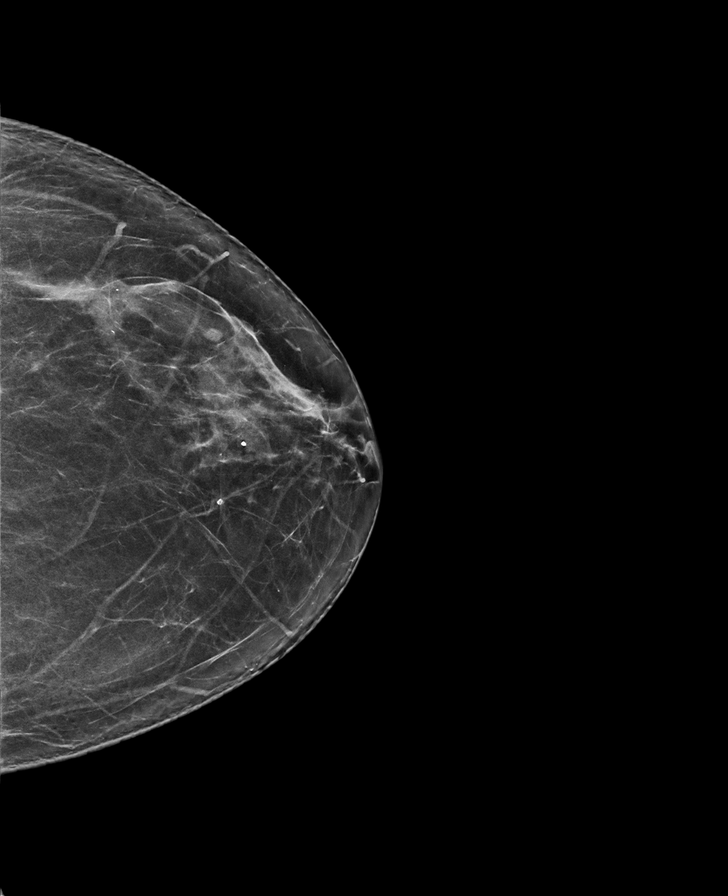

[L MLO synth-2D]
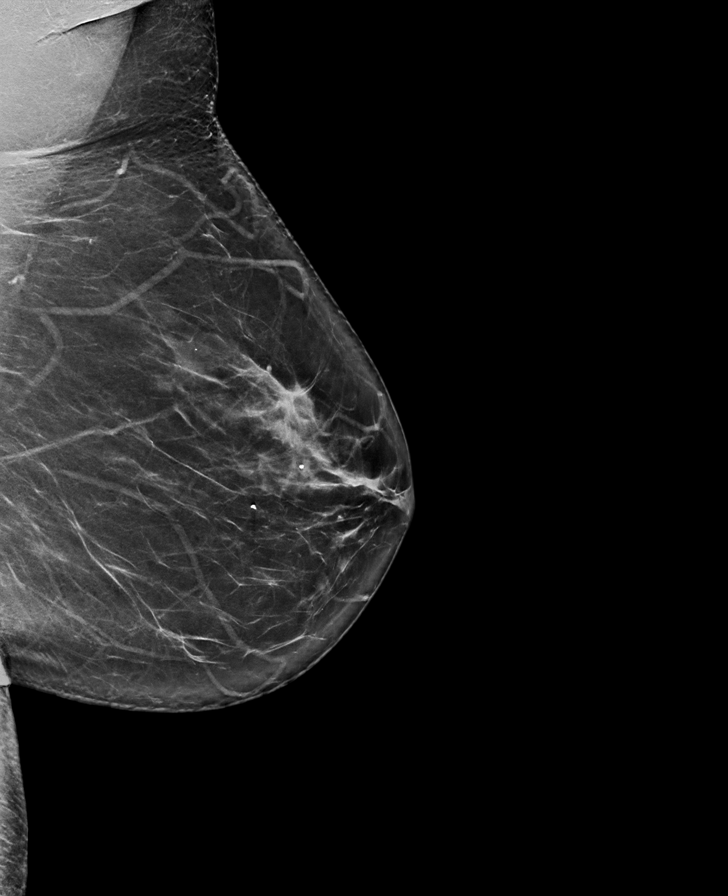

[L CC]
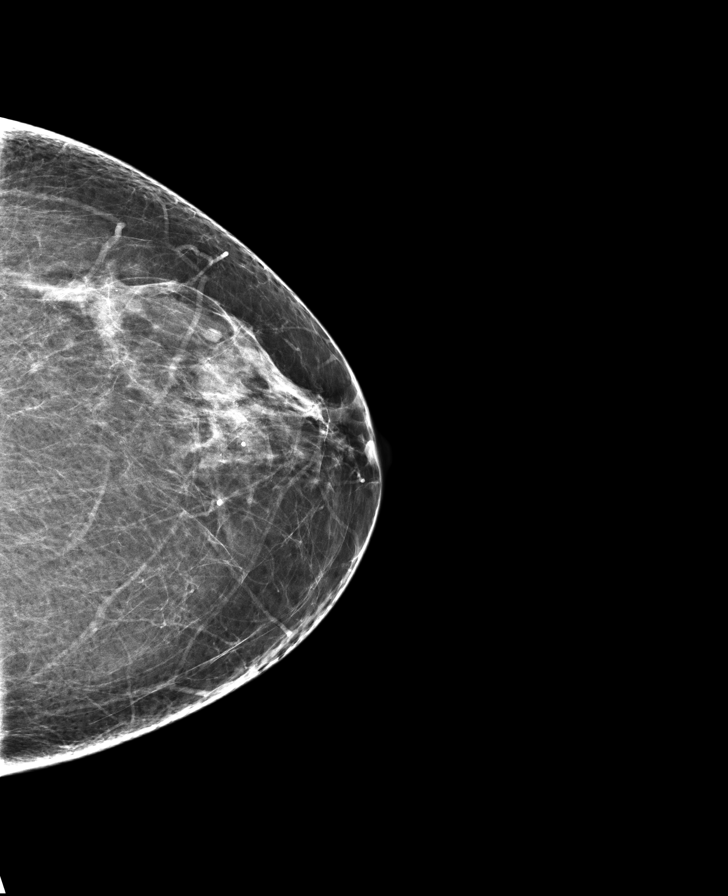

[R CC]
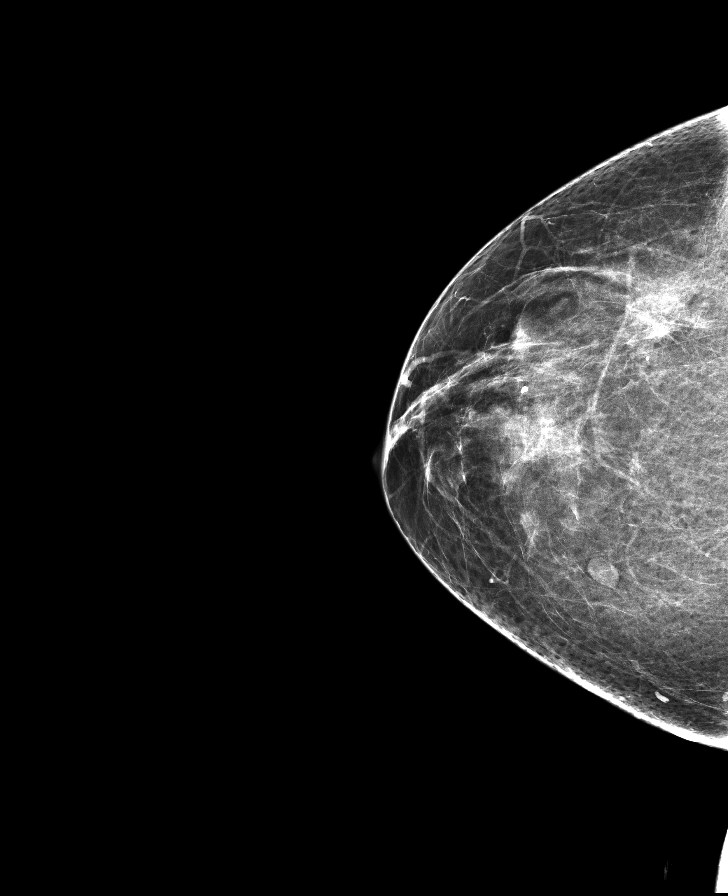

[R MLO]
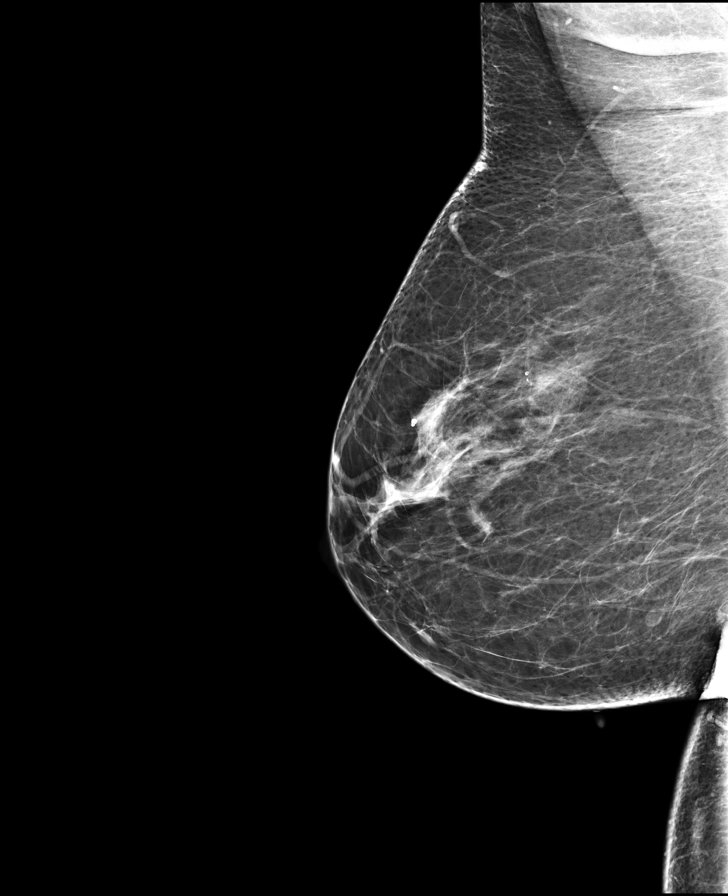

[R CC synth-2D]
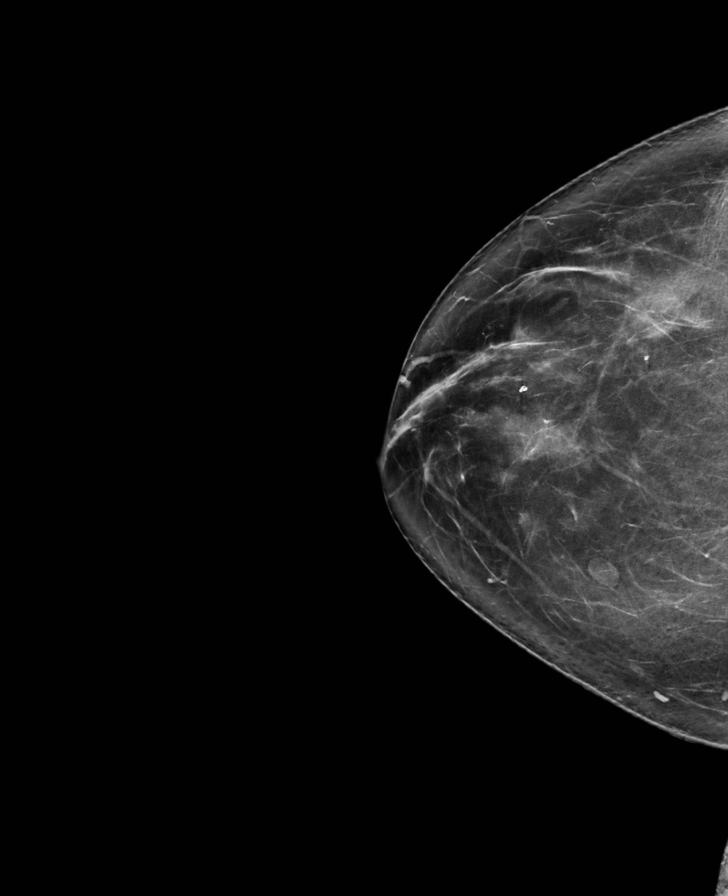

[R MLO synth-2D]
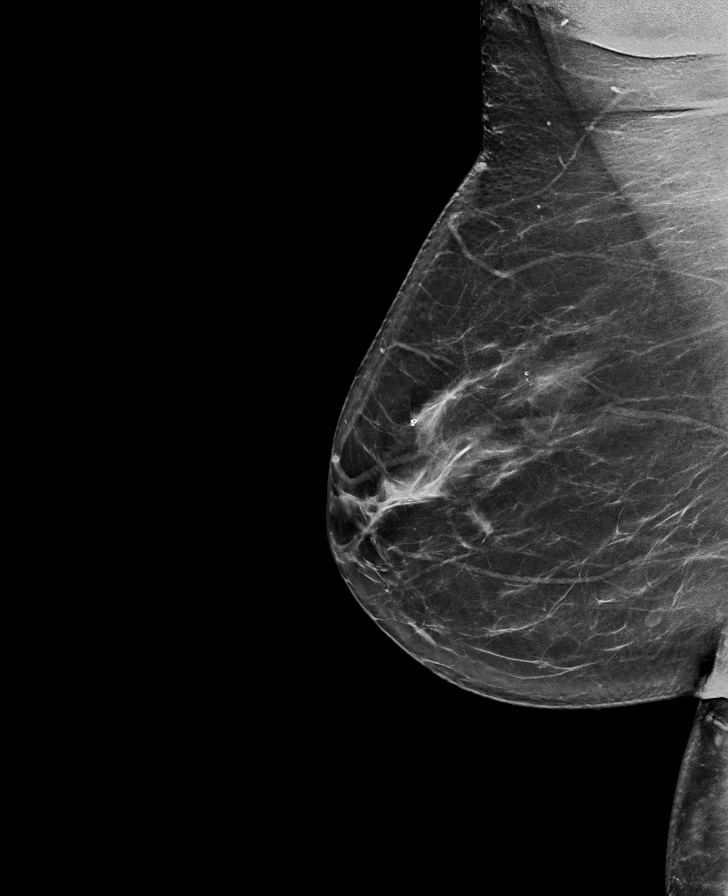

[8 of 29 positions shown; findings below may reference images not displayed]

ACR Breast Density Category b: There are scattered areas of
fibroglandular density.
FINDINGS: Cc and MLO views of bilateral breasts are submitted. Previously
noted mass in the lateral left breast is unchanged. The right
breasts is stable.

Mammographic images were processed with CAD.

Targeted ultrasound is performed, showing intramammary node the
versus minimal complicated cyst at the left breast 3 o'clock 3 cm
from nipple unchanged compared prior exam. This correlates to the
mammographic mass.
IMPRESSION: Probable benign findings.

RECOMMENDATION:
Twelve month follow-up left breast ultrasound.

I have discussed the findings and recommendations with the patient.
Results were also provided in writing at the conclusion of the
visit. If applicable, a reminder letter will be sent to the patient
regarding the next appointment.

BI-RADS CATEGORY  3: Probably benign.

## 2018-11-21 ENCOUNTER — Encounter: Payer: BLUE CROSS/BLUE SHIELD | Admitting: Internal Medicine

## 2019-02-23 ENCOUNTER — Encounter: Payer: BLUE CROSS/BLUE SHIELD | Admitting: Internal Medicine

## 2020-03-23 DIAGNOSIS — M069 Rheumatoid arthritis, unspecified: Secondary | ICD-10-CM

## 2020-03-23 DIAGNOSIS — Z8616 Personal history of COVID-19: Secondary | ICD-10-CM

## 2020-03-23 DIAGNOSIS — K760 Fatty (change of) liver, not elsewhere classified: Secondary | ICD-10-CM

## 2020-03-23 DIAGNOSIS — R7301 Impaired fasting glucose: Secondary | ICD-10-CM

## 2020-03-23 DIAGNOSIS — E034 Atrophy of thyroid (acquired): Secondary | ICD-10-CM

## 2020-03-23 DIAGNOSIS — R5383 Other fatigue: Secondary | ICD-10-CM

## 2020-03-25 ENCOUNTER — Encounter: Payer: BLUE CROSS/BLUE SHIELD | Admitting: Internal Medicine

## 2020-04-04 ENCOUNTER — Telehealth: Payer: Self-pay

## 2020-04-04 DIAGNOSIS — Z8616 Personal history of COVID-19: Secondary | ICD-10-CM

## 2020-04-04 DIAGNOSIS — R5383 Other fatigue: Secondary | ICD-10-CM

## 2020-04-04 DIAGNOSIS — K760 Fatty (change of) liver, not elsewhere classified: Secondary | ICD-10-CM

## 2020-04-04 DIAGNOSIS — E034 Atrophy of thyroid (acquired): Secondary | ICD-10-CM

## 2020-04-04 DIAGNOSIS — M069 Rheumatoid arthritis, unspecified: Secondary | ICD-10-CM

## 2020-04-04 DIAGNOSIS — R7301 Impaired fasting glucose: Secondary | ICD-10-CM

## 2020-04-04 NOTE — Telephone Encounter (Signed)
Labs have been reordered for [facility].

## 2020-04-05 LAB — COMPREHENSIVE METABOLIC PANEL
ALT: 23 IU/L (ref 0–32)
AST: 25 IU/L (ref 0–40)
Albumin/Globulin Ratio: 1.8 (ref 1.2–2.2)
Albumin: 4.5 g/dL (ref 3.8–4.9)
Alkaline Phosphatase: 83 IU/L (ref 48–121)
BUN/Creatinine Ratio: 17 (ref 9–23)
BUN: 16 mg/dL (ref 6–24)
Bilirubin Total: 0.3 mg/dL (ref 0.0–1.2)
CO2: 21 mmol/L (ref 20–29)
Calcium: 9.9 mg/dL (ref 8.7–10.2)
Chloride: 104 mmol/L (ref 96–106)
Creatinine, Ser: 0.92 mg/dL (ref 0.57–1.00)
GFR calc Af Amer: 79 mL/min/{1.73_m2} (ref >59)
GFR calc non Af Amer: 68 mL/min/{1.73_m2} (ref >59)
Globulin, Total: 2.5 g/dL (ref 1.5–4.5)
Glucose: 92 mg/dL (ref 65–99)
Potassium: 4.5 mmol/L (ref 3.5–5.2)
Sodium: 140 mmol/L (ref 134–144)
Total Protein: 7 g/dL (ref 6.0–8.5)

## 2020-04-05 LAB — TSH: TSH: 2.91 u[IU]/mL (ref 0.450–4.500)

## 2020-04-05 LAB — CBC WITH DIFFERENTIAL/PLATELET
Basophils Absolute: 0.1 10*3/uL (ref 0.0–0.2)
Basos: 1 %
EOS (ABSOLUTE): 0.1 10*3/uL (ref 0.0–0.4)
Eos: 2 %
Hematocrit: 40.2 % (ref 34.0–46.6)
Hemoglobin: 13.5 g/dL (ref 11.1–15.9)
Immature Grans (Abs): 0 10*3/uL (ref 0.0–0.1)
Immature Granulocytes: 0 %
Lymphocytes Absolute: 1.6 10*3/uL (ref 0.7–3.1)
Lymphs: 34 %
MCH: 30.5 pg (ref 26.6–33.0)
MCHC: 33.6 g/dL (ref 31.5–35.7)
MCV: 91 fL (ref 79–97)
Monocytes Absolute: 0.5 10*3/uL (ref 0.1–0.9)
Monocytes: 10 %
Neutrophils Absolute: 2.6 10*3/uL (ref 1.4–7.0)
Neutrophils: 53 %
Platelets: 261 10*3/uL (ref 150–450)
RBC: 4.42 x10E6/uL (ref 3.77–5.28)
RDW: 12.3 % (ref 11.7–15.4)
WBC: 4.8 10*3/uL (ref 3.4–10.8)

## 2020-04-05 LAB — LIPID PANEL
Chol/HDL Ratio: 2.6 ratio (ref 0.0–4.4)
Cholesterol, Total: 206 mg/dL — ABNORMAL HIGH (ref 100–199)
HDL: 78 mg/dL (ref >39)
LDL Chol Calc (NIH): 118 mg/dL — ABNORMAL HIGH (ref 0–99)
Triglycerides: 56 mg/dL (ref 0–149)
VLDL Cholesterol Cal: 10 mg/dL (ref 5–40)

## 2020-04-05 LAB — SEDIMENTATION RATE: Sed Rate: 2 mm/hr (ref 0–40)

## 2020-04-05 LAB — ANA: Anti Nuclear Antibody (ANA): NEGATIVE

## 2020-04-05 LAB — URINALYSIS, ROUTINE W REFLEX MICROSCOPIC
Bilirubin, UA: NEGATIVE
Glucose, UA: NEGATIVE
Ketones, UA: NEGATIVE
Leukocytes,UA: NEGATIVE
Nitrite, UA: NEGATIVE
Protein,UA: NEGATIVE
RBC, UA: NEGATIVE
Specific Gravity, UA: 1.028 (ref 1.005–1.030)
Urobilinogen, Ur: 0.2 mg/dL (ref 0.2–1.0)
pH, UA: 5.5 (ref 5.0–7.5)

## 2020-04-05 LAB — HEMOGLOBIN A1C
Est. average glucose Bld gHb Est-mCnc: 111 mg/dL
Hgb A1c MFr Bld: 5.5 % (ref 4.8–5.6)

## 2020-04-05 LAB — SARS-COV-2 ANTIBODY, IGM: SARS-CoV-2 Antibody, IgM: NEGATIVE

## 2020-04-07 ENCOUNTER — Encounter: Payer: Self-pay | Admitting: Internal Medicine

## 2020-04-07 ENCOUNTER — Telehealth: Payer: Self-pay | Admitting: Internal Medicine

## 2020-04-07 ENCOUNTER — Other Ambulatory Visit: Payer: Self-pay

## 2020-04-07 ENCOUNTER — Ambulatory Visit (INDEPENDENT_AMBULATORY_CARE_PROVIDER_SITE_OTHER): Payer: BLUE CROSS/BLUE SHIELD | Admitting: Internal Medicine

## 2020-04-07 VITALS — BP 142/98 | HR 61 | Temp 97.7°F | Resp 15 | Ht 63.0 in | Wt 178.4 lb

## 2020-04-07 DIAGNOSIS — R030 Elevated blood-pressure reading, without diagnosis of hypertension: Secondary | ICD-10-CM

## 2020-04-07 DIAGNOSIS — Z0000 Encounter for general adult medical examination without abnormal findings: Secondary | ICD-10-CM | POA: Diagnosis not present

## 2020-04-07 DIAGNOSIS — E559 Vitamin D deficiency, unspecified: Secondary | ICD-10-CM

## 2020-04-07 DIAGNOSIS — K760 Fatty (change of) liver, not elsewhere classified: Secondary | ICD-10-CM

## 2020-04-07 DIAGNOSIS — Z8616 Personal history of COVID-19: Secondary | ICD-10-CM | POA: Diagnosis not present

## 2020-04-07 DIAGNOSIS — E6609 Other obesity due to excess calories: Secondary | ICD-10-CM | POA: Diagnosis not present

## 2020-04-07 DIAGNOSIS — Z1231 Encounter for screening mammogram for malignant neoplasm of breast: Secondary | ICD-10-CM

## 2020-04-07 DIAGNOSIS — Z6832 Body mass index (BMI) 32.0-32.9, adult: Secondary | ICD-10-CM

## 2020-04-07 DIAGNOSIS — Z8639 Personal history of other endocrine, nutritional and metabolic disease: Secondary | ICD-10-CM

## 2020-04-07 DIAGNOSIS — N6002 Solitary cyst of left breast: Secondary | ICD-10-CM

## 2020-04-07 DIAGNOSIS — D1722 Benign lipomatous neoplasm of skin and subcutaneous tissue of left arm: Secondary | ICD-10-CM

## 2020-04-07 MED ORDER — ZOSTER VAC RECOMB ADJUVANTED 50 MCG/0.5ML IM SUSR
0.5000 mL | Freq: Once | INTRAMUSCULAR | 1 refills | Status: AC
Start: 2020-04-07 — End: 2020-04-07

## 2020-04-07 NOTE — Assessment & Plan Note (Signed)
ALT  Is no longer elevated  Lab Results  Component Value Date   ALT 23 04/04/2020   AST 25 04/04/2020   ALKPHOS 83 04/04/2020   BILITOT 0.3 04/04/2020

## 2020-04-07 NOTE — Patient Instructions (Addendum)
I'M GLAD YOU ARE LOSING WEIGHT!  YOUR Goal is 18 lbs  Over the next 6 months  Start with walking 30 minutes daily.  https://www.matthews.info/ is a great tool for weight loss  The Optavia Diet is a very good diet (less animal fat than the KETO diet)    The new goals for optimal blood pressure management are 120/70.  Please check your blood pressure at least 7 times  at home and send me the readings so I can determine if you need a change in medication     The lump on your shoulder is a lipoma .  It can be surgically removed any time you want , but it is NOT CANCER    I do recommend the Shingrx and Hep A/B vaccines  Your annual mammogram has been ordered.  You are encouraged (required) to call to make your appointment at [place] [phone]

## 2020-04-07 NOTE — Telephone Encounter (Signed)
I'll try but if not,  I will reordered the Vitamin D supplement to start taking since her last level was so low in 2018

## 2020-04-07 NOTE — Progress Notes (Signed)
Patient ID: Marie Howard, female    DOB: 12-26-1960  Age: 59 y.o. MRN: [id number]  The patient is here for annual preventive  examination and management of other chronic and acute problems.  This visit occurred during the SARS-CoV-2 public health emergency.  Safety protocols were in place, including screening questions prior to the visit, additional usage of staff PPE, and extensive cleaning of exam room while observing appropriate contact time as indicated for disinfecting solutions.    Patient has  Not received either  COVID 19 vaccine . She is apprehensive about the long term side effects of the vaccine and she has had the infection and recovered uneventfully and prefers to rely on natural immunity.  IgM was recently negative.   Patient continues to mask when outside of the home except when walking in yard or at safe distances from others .  Patient denies any change in mood or development of unhealthy behaviors resulting from the pandemic's restriction of activities and socialization.     The risk factors are reflected in the social history.  The roster of all physicians providing medical care to patient - is listed in the Snapshot section of the chart.  Activities of daily living:  The patient is 100% independent in all ADLs: dressing, toileting, feeding as well as independent mobility  Home safety : The patient has smoke detectors in the home. They wear seatbelts.  There are no firearms at home. There is no violence in the home.   There is no risks for hepatitis, STDs or HIV. There is no   history of blood transfusion. They have no travel history to infectious disease endemic areas of the world.  The patient has seen their dentist in the last six month. They have seen their eye doctor in the last year. They admit to slight hearing difficulty with regard to whispered voices and some television programs.  They have deferred audiologic testing in the last year.  They do not  have excessive sun  exposure. Discussed the need for sun protection: hats, long sleeves and use of sunscreen if there is significant sun exposure.   Diet: the importance of a healthy diet is discussed. They do have a healthy diet.  The benefits of regular aerobic exercise were discussed. She walks 4 times per week ,  20 minutes.   Depression screen: there are no signs or vegative symptoms of depression- irritability, change in appetite, anhedonia, sadness/tearfullness.  The following portions of the patient's history were reviewed and updated as appropriate: allergies, current medications, past family history, past medical history,  past surgical history, past social history  and problem list.  Visual acuity was not assessed per patient preference since she has regular follow up with her ophthalmologist. Hearing and body mass index were assessed and reviewed.   During the course of the visit the patient was educated and counseled about appropriate screening and preventive services including : fall prevention , diabetes screening, nutrition counseling, colorectal cancer screening, and recommended immunizations.    CC: The primary encounter diagnosis was Hepatic steatosis. Diagnoses of Class 1 obesity due to excess calories with serious comorbidity and body mass index (BMI) of 32.0 to 32.9 in adult, History of COVID-19, Encounter for screening mammogram for malignant neoplasm of breast, Vitamin D deficiency, Benign breast cyst in female, left, History of hypothyroidism, History of 2019 novel coronavirus disease (COVID-19), Encounter for preventive health examination, Elevated blood pressure reading without diagnosis of hypertension, and Lipoma of left shoulder were also  pertinent to this visit.  1)  Weight gain:  Due to lack of regular exercise and lack of portion control. Reviewed BMI and history of fatty liver   2) Elevated blood pressure reading: patient has a history of white coat hypertension,    Home readings have  been <  130/80  3) Recent covid infection: recovered uneventfully.  Does not want vaccine.  4) Vitamin D deficiency. Taking otc supplement.   History Marie Howard has a past medical history of Arthritis, GERD (gastroesophageal reflux disease), History of chicken pox, and Hyperlipidemia.   She has a past surgical history that includes Colonoscopy with propofol (N/A, 07/02/2018).   Her family history includes Arthritis in her father and mother; Cancer in her paternal grandfather; Heart disease in her paternal grandmother; Heart disease (age of onset: 69) in her father; Hyperlipidemia in her father, mother, and paternal grandmother; Hypertension in her father and mother; Stroke in her paternal grandmother.She reports that she has never smoked. She has never used smokeless tobacco. She reports current alcohol use. She reports that she does not use drugs.  Outpatient Medications Prior to Visit  Medication Sig Dispense Refill  . cholecalciferol (VITAMIN D) 1000 units tablet Take 1,000 Units by mouth daily.    . Probiotic Product (PROBIOTIC-10 PO) Take by mouth.    . triamcinolone cream (KENALOG) 0.1 % Apply 1 application 2 (two) times daily topically. 30 g 0  . predniSONE (DELTASONE) 10 MG tablet 6 tablets on Day 1 , then reduce by 1 tablet daily until gone (Patient not taking: Reported on 04/07/2020) 21 tablet 0   No facility-administered medications prior to visit.    Review of Systems   Patient denies headache, fevers, malaise, unintentional weight loss, skin rash, eye pain, sinus congestion and sinus pain, sore throat, dysphagia,  hemoptysis , cough, dyspnea, wheezing, chest pain, palpitations, orthopnea, edema, abdominal pain, nausea, melena, diarrhea, constipation, flank pain, dysuria, hematuria, urinary  Frequency, nocturia, numbness, tingling, seizures,  Focal weakness, Loss of consciousness,  Tremor, insomnia, depression, anxiety, and suicidal ideation.      Objective:  BP (!) 142/98 (BP  Location: Left Arm, Patient Position: Sitting, Cuff Size: Normal)   Pulse 61   Temp 97.7 F (36.5 C) (Temporal)   Resp 15   Ht 5\' 3"  (1.6 m)   Wt 178 lb 6.4 oz (80.9 kg)   SpO2 99%   BMI 31.60 kg/m   Physical Exam   General appearance: alert, cooperative and appears stated age Head: Normocephalic, without obvious abnormality, atraumatic Eyes: conjunctivae/corneas clear. PERRL, EOM's intact. Fundi benign. Ears: normal TM's and external ear canals both ears Nose: Nares normal. Septum midline. Mucosa normal. No drainage or sinus tenderness. Throat: lips, mucosa, and tongue normal; teeth and gums normal Neck: no adenopathy, no carotid bruit, no JVD, supple, symmetrical, trachea midline and thyroid not enlarged, symmetric, no tenderness/mass/nodules Lungs: clear to auscultation bilaterally Breasts: normal appearance, no masses or tenderness Heart: regular rate and rhythm, S1, S2 normal, no murmur, click, rub or gallop Abdomen: soft, non-tender; bowel sounds normal; no masses,  no organomegaly MSK:  Left shoulder with subcutaneous nontender mobile mass c/w/ lipoma Extremities: extremities normal, atraumatic, no cyanosis or edema Pulses: 2+ and symmetric Skin: Skin color, texture, turgor normal. No rashes or lesions Neurologic: Alert and oriented X 3, normal strength and tone. Normal symmetric reflexes. Normal coordination and gait.      Assessment & Plan:   Problem List Items Addressed This Visit      Unprioritized  Benign breast cyst in female, left    Continue annual 3 d screening.  Cyst not palpable on today's breast exam       Elevated blood pressure reading without diagnosis of hypertension    shee has no prior history of hypertension. she will check his blood pressure several times over the next 3-4 weeks and to submit readings for evaluation.      Encounter for preventive health examination    age appropriate education and counseling updated, referrals for preventative  services and immunizations addressed, dietary and smoking counseling addressed, most recent labs reviewed.  I have personally reviewed and have noted:  1) the patient's medical and social history 2) The pt's use of alcohol, tobacco, and illicit drugs 3) The patient's current medications and supplements 4) Functional ability including ADL's, fall risk, home safety risk, hearing and visual impairment 5) Diet and physical activities 6) Evidence for depression or mood disorder 7) The patient's height, weight, and BMI have been recorded in the chart  I have made referrals, and provided counseling and education based on review of the above      Hepatic steatosis - Primary    ALT  Is no longer elevated  Lab Results  Component Value Date   ALT 23 04/04/2020   AST 25 04/04/2020   ALKPHOS 83 04/04/2020   BILITOT 0.3 04/04/2020         Relevant Orders   Iron, TIBC and Ferritin Panel (Completed)   Comprehensive metabolic panel   History of 2019 novel coronavirus disease (COVID-19)    Her IgG antibody is positive,  Suggesting naturally acquired immunity may be protective        History of hypothyroidism    Thyroid function is WNL without medication .   Lab Results  Component Value Date   TSH 2.910 04/04/2020         Lipoma of left shoulder    Noted on exam today.  No intervention requested at this time       Obesity    I have addressed  BMI and recommended wt loss of 10% of body weight over the next 6 months using a low glycemic index diet and regular exercise a minimum of 5 days per week.        Relevant Orders   Hemoglobin A1c   Vitamin D deficiency    Repeat level is borderline low.  Recommend using 2000 Ius daily  . Previously required megadose weekly   Last vitamin D Lab Results  Component Value Date   VD25OH 25.9 (L) 04/07/2020         Other Visit Diagnoses    History of COVID-19       Relevant Orders   SAR CoV2 Serology (COVID 19)AB(IGG)IA (Completed)    Encounter for screening mammogram for malignant neoplasm of breast       Relevant Orders   MM 3D SCREEN BREAST BILATERAL      I have discontinued Lyndsee W. Bubeck's predniSONE. I am also having her start on Zoster Vaccine Adjuvanted. Additionally, I am having her maintain her triamcinolone cream, cholecalciferol, and Probiotic Product (PROBIOTIC-10 PO).  Meds ordered this encounter  Medications  . Zoster Vaccine Adjuvanted [facility]) injection    Sig: Inject 0.5 mLs into the muscle once for 1 dose.    Dispense:  1 each    Refill:  1    Medications Discontinued During This Encounter  Medication Reason  . predniSONE (DELTASONE) 10 MG tablet Completed Course  Follow-up: No follow-ups on file.   Crecencio Mc, MD

## 2020-04-07 NOTE — Telephone Encounter (Signed)
Pt wants to know if we can add a vitamin d level to her blood work today?

## 2020-04-07 NOTE — Telephone Encounter (Signed)
LMTCB

## 2020-04-07 NOTE — Telephone Encounter (Signed)
Pt called back wanted to know if you could put order d3 with her blood done today

## 2020-04-08 LAB — IRON,TIBC AND FERRITIN PANEL
Ferritin: 78 ng/mL (ref 15–150)
Iron Saturation: 30 % (ref 15–55)
Iron: 106 ug/dL (ref 27–159)
Total Iron Binding Capacity: 352 ug/dL (ref 250–450)
UIBC: 246 ug/dL (ref 131–425)

## 2020-04-08 LAB — VITAMIN D 25 HYDROXY (VIT D DEFICIENCY, FRACTURES): Vit D, 25-Hydroxy: 25.9 ng/mL — ABNORMAL LOW (ref 30.0–100.0)

## 2020-04-08 LAB — SAR COV2 SEROLOGY (COVID19)AB(IGG),IA: DiaSorin SARS-CoV-2 Ab, IgG: POSITIVE

## 2020-04-09 ENCOUNTER — Encounter: Payer: Self-pay | Admitting: Internal Medicine

## 2020-04-09 DIAGNOSIS — D1722 Benign lipomatous neoplasm of skin and subcutaneous tissue of left arm: Secondary | ICD-10-CM | POA: Insufficient documentation

## 2020-04-09 DIAGNOSIS — Z8616 Personal history of COVID-19: Secondary | ICD-10-CM | POA: Insufficient documentation

## 2020-04-09 DIAGNOSIS — R030 Elevated blood-pressure reading, without diagnosis of hypertension: Secondary | ICD-10-CM | POA: Insufficient documentation

## 2020-04-09 NOTE — Assessment & Plan Note (Addendum)
shee has no prior history of hypertension. she will check his blood pressure several times over the next 3-4 weeks and to submit readings for evaluation.

## 2020-04-09 NOTE — Assessment & Plan Note (Addendum)
Repeat level is borderline low.  Recommend using 2000 Ius daily  . Previously required megadose weekly   Last vitamin D Lab Results  Component Value Date   VD25OH 25.9 (L) 04/07/2020

## 2020-04-09 NOTE — Assessment & Plan Note (Signed)
Noted on exam today.  No intervention requested at this time

## 2020-04-09 NOTE — Assessment & Plan Note (Signed)
Her IgG antibody is positive,  Suggesting naturally acquired immunity may be protective

## 2020-04-09 NOTE — Assessment & Plan Note (Addendum)
Thyroid function is WNL without medication .   Lab Results  Component Value Date   TSH 2.910 04/04/2020

## 2020-04-09 NOTE — Assessment & Plan Note (Signed)
I have addressed  BMI and recommended wt loss of 10% of body weight over the next 6 months using a low glycemic index diet and regular exercise a minimum of 5 days per week.   

## 2020-04-09 NOTE — Assessment & Plan Note (Signed)
Continue annual 3 d screening.  Cyst not palpable on today's breast exam

## 2020-04-09 NOTE — Assessment & Plan Note (Signed)

## 2020-04-19 ENCOUNTER — Other Ambulatory Visit: Payer: Self-pay | Admitting: Internal Medicine

## 2020-04-19 DIAGNOSIS — N6320 Unspecified lump in the left breast, unspecified quadrant: Secondary | ICD-10-CM

## 2020-04-19 DIAGNOSIS — R928 Other abnormal and inconclusive findings on diagnostic imaging of breast: Secondary | ICD-10-CM

## 2020-04-19 DIAGNOSIS — Z1231 Encounter for screening mammogram for malignant neoplasm of breast: Secondary | ICD-10-CM

## 2020-05-04 ENCOUNTER — Ambulatory Visit
Admission: RE | Admit: 2020-05-04 | Discharge: 2020-05-04 | Disposition: A | Discharge status: Home / Self Care | Payer: BLUE CROSS/BLUE SHIELD | Source: Ambulatory Visit | Attending: Internal Medicine | Admitting: Internal Medicine

## 2020-05-04 DIAGNOSIS — Z1231 Encounter for screening mammogram for malignant neoplasm of breast: Secondary | ICD-10-CM | POA: Insufficient documentation

## 2020-05-04 DIAGNOSIS — N6320 Unspecified lump in the left breast, unspecified quadrant: Secondary | ICD-10-CM | POA: Diagnosis present

## 2020-05-04 DIAGNOSIS — R928 Other abnormal and inconclusive findings on diagnostic imaging of breast: Secondary | ICD-10-CM | POA: Diagnosis not present

## 2020-06-29 ENCOUNTER — Ambulatory Visit (INDEPENDENT_AMBULATORY_CARE_PROVIDER_SITE_OTHER): Payer: BLUE CROSS/BLUE SHIELD | Admitting: Dermatology

## 2020-06-29 ENCOUNTER — Other Ambulatory Visit: Payer: Self-pay

## 2020-06-29 DIAGNOSIS — L578 Other skin changes due to chronic exposure to nonionizing radiation: Secondary | ICD-10-CM

## 2020-06-29 DIAGNOSIS — L821 Other seborrheic keratosis: Secondary | ICD-10-CM

## 2020-06-29 DIAGNOSIS — L918 Other hypertrophic disorders of the skin: Secondary | ICD-10-CM

## 2020-06-29 DIAGNOSIS — D229 Melanocytic nevi, unspecified: Secondary | ICD-10-CM

## 2020-06-29 DIAGNOSIS — Z1283 Encounter for screening for malignant neoplasm of skin: Secondary | ICD-10-CM | POA: Diagnosis not present

## 2020-06-29 DIAGNOSIS — L814 Other melanin hyperpigmentation: Secondary | ICD-10-CM

## 2020-06-29 DIAGNOSIS — L2089 Other atopic dermatitis: Secondary | ICD-10-CM | POA: Diagnosis not present

## 2020-06-29 DIAGNOSIS — D1800 Hemangioma unspecified site: Secondary | ICD-10-CM

## 2020-06-29 DIAGNOSIS — L80 Vitiligo: Secondary | ICD-10-CM

## 2020-06-29 MED ORDER — EUCRISA 2 % EX OINT: 1.0000 "application " | TOPICAL_OINTMENT | Freq: Two times a day (BID) | CUTANEOUS | 3 refills | Status: DC

## 2020-06-29 NOTE — Progress Notes (Signed)
   Follow-Up Visit   Subjective  Marie Howard is a 59 y.o. female who presents for the following: Annual Exam (TBSE today) and Rash (bilateral hands - dry and irritated). The patient presents for Total-Body Skin Exam (TBSE) for skin cancer screening and mole check.  The following portions of the chart were reviewed this encounter and updated as appropriate:  Tobacco  Allergies  Meds  Problems  Med Hx  Surg Hx  Fam Hx     Review of Systems:  No other skin or systemic complaints except as noted in HPI or Assessment and Plan.  Objective  Well appearing patient in no apparent distress; mood and affect are within normal limits.  A full examination was performed including scalp, head, eyes, ears, nose, lips, neck, chest, axillae, abdomen, back, buttocks, bilateral upper extremities, bilateral lower extremities, hands, feet, fingers, toes, fingernails, and toenails. All findings within normal limits unless otherwise noted below.  Objective  Left 3rd Finger Metacarpophalangeal Joint: Pinkness and scale of bilateral dorsum hands  Objective  Axillary areas: Depigmented patches.   Assessment & Plan    Lentigines - Scattered tan macules - Discussed due to sun exposure - Benign, observe - Call for any changes  Seborrheic Keratoses - Stuck-on, waxy, tan-brown papules and plaques  - Discussed benign etiology and prognosis. - Observe - Call for any changes  Melanocytic Nevi - Tan-brown and/or pink-flesh-colored symmetric macules and papules - Benign appearing on exam today - Observation - Call clinic for new or changing moles - Recommend daily use of broad spectrum spf 30+ sunscreen to sun-exposed areas.   Hemangiomas - Red papules - Discussed benign nature - Observe - Call for any changes  Actinic Damage - diffuse scaly erythematous macules with underlying dyspigmentation - Recommend daily broad spectrum sunscreen SPF 30+ to sun-exposed areas, reapply every 2 hours  as needed.  - Call for new or changing lesions.  Skin cancer screening performed today.  Acrochordons (Skin Tags) - Fleshy, skin-colored pedunculated papules - Benign appearing.  - Observe. - If desired, they can be removed with an in office procedure that is not covered by insurance. - Please call the clinic if you notice any new or changing lesions.  Atopic dermatitis/hand dermatitis Atopic dermatitis of hands/Hand dermatitis - Recommend Cerave cream several times daily and  Start Eucrisa daily to twice daily  Crisaborole (EUCRISA) 2 % OINT - Left 3rd Finger Metacarpophalangeal Joint  Vitiligo Axillary areas -new diagnosis Patient desires no treatment today.  Discussed condition. Recent labs including thyroid reviewed and all were WNL.  Return if symptoms worsen or fail to improve.  I, Ashok Cordia, CMA, am acting as scribe for Sarina Ser, MD .  Documentation: I have reviewed the above documentation for accuracy and completeness, and I agree with the above.  Sarina Ser, MD

## 2020-07-05 ENCOUNTER — Encounter: Payer: Self-pay | Admitting: Dermatology

## 2020-07-08 ENCOUNTER — Encounter: Payer: Self-pay | Admitting: Dermatology

## 2020-07-12 NOTE — Telephone Encounter (Signed)
Reviewing patient's chart I don't see she has tried or failed a topical steroid or tacrolimus/elidel. The PA would be denied. Can you suggest alternative? Thank you.

## 2020-07-13 MED ORDER — TACROLIMUS 0.1 % EX OINT
TOPICAL_OINTMENT | Freq: Two times a day (BID) | CUTANEOUS | 2 refills | Status: DC
Start: 2020-07-13 — End: 2022-04-12

## 2020-07-15 ENCOUNTER — Encounter: Payer: Self-pay | Admitting: Internal Medicine

## 2020-07-15 ENCOUNTER — Telehealth (INDEPENDENT_AMBULATORY_CARE_PROVIDER_SITE_OTHER): Payer: BLUE CROSS/BLUE SHIELD | Admitting: Internal Medicine

## 2020-07-15 DIAGNOSIS — Z8616 Personal history of COVID-19: Secondary | ICD-10-CM | POA: Diagnosis not present

## 2020-07-15 NOTE — Progress Notes (Signed)
Virtual Visit via [place]  This visit type was conducted due to national recommendations for restrictions regarding the COVID-19 pandemic (e.g. social distancing).  This format is felt to be most appropriate for this patient at this time.  All issues noted in this document were discussed and addressed.  No physical exam was performed (except for noted visual exam findings with Video Visits).   I connected with@ on 07/15/20 at 11:30 AM EDT by a video enabled telemedicine applicationand verified that I am speaking with the correct person using two identifiers. Location patient: home Location provider: work or home office Persons participating in the virtual visit: patient, provider  I discussed the limitations, risks, security and privacy concerns of performing an evaluation and management service by telephone and the availability of in person appointments. I also discussed with the patient that there may be a patient responsible charge related to this service. The patient expressed understanding and agreed to proceed.   Reason for visit: concern about THE SAFETY of COVID 80 VACCINES  HPI:  59 yr old female with personal history of COVID 19 infection in January 2021 (entire extended family became ill), positive IgG antibody test  Apr 07 2020 , presents for discussions of the risks and benefits of getting vaccinated. She is very apprehensive due to anecdotal reports from 3 people known to her of adverse effects that were devasating after receiving the [place] (blood clots, death and SVT)     Patient has received NO  doses of the available COVID 19 vaccines due to concern about the effect of non FDA approved mRNA vaccines on future pregnancies .   Patient continues to mask when outside of the home except when walking in yard or at safe distances from others .  Patient denies any change in mood or development of unhealthy behaviors resuting from the pandemic's restriction of activities and  socialization.   ROS: See pertinent positives and negatives per HPI.  Past Medical History:  Diagnosis Date  . Arthritis   . GERD (gastroesophageal reflux disease)   . History of chicken pox   . Hyperlipidemia     Past Surgical History:  Procedure Laterality Date  . COLONOSCOPY WITH PROPOFOL N/A 07/02/2018   Procedure: COLONOSCOPY WITH PROPOFOL;  Surgeon: Robert Bellow, MD;  Location: [facility] ENDOSCOPY;  Service: Endoscopy;  Laterality: N/A;    Family History  Problem Relation Age of Onset  . Arthritis Mother   . Hyperlipidemia Mother   . Hypertension Mother   . Arthritis Father   . Hyperlipidemia Father   . Heart disease Father 19       CAD s/p 2 vessel CABG   . Hypertension Father   . Heart disease Paternal Grandmother   . Hyperlipidemia Paternal Grandmother   . Stroke Paternal Grandmother   . Cancer Paternal Grandfather        colon    SOCIAL HX:  reports that she has never smoked. She has never used smokeless tobacco. She reports current alcohol use. She reports that she does not use drugs.   Current Outpatient Medications:  .  cholecalciferol (VITAMIN D) 1000 units tablet, Take 1,000 Units by mouth daily., Disp: , Rfl:  .  Probiotic Product (PROBIOTIC-10 PO), Take by mouth., Disp: , Rfl:  .  triamcinolone cream (KENALOG) 0.1 %, Apply 1 application 2 (two) times daily topically., Disp: 30 g, Rfl: 0 .  tacrolimus (PROTOPIC) 0.1 % ointment, Apply topically in the morning and at bedtime. (Patient not taking: Reported on  07/15/2020), Disp: 100 g, Rfl: 2  EXAM:  VITALS per patient if applicable:  GENERAL: alert, oriented, appears well and in no acute distress  HEENT: atraumatic, conjunttiva clear, no obvious abnormalities on inspection of external nose and ears  NECK: normal movements of the head and neck  LUNGS: on inspection no signs of respiratory distress, breathing rate appears normal, no obvious gross SOB, gasping or wheezing  CV: no obvious  cyanosis  MS: moves all visible extremities without noticeable abnormality  PSYCH/NEURO: pleasant and cooperative, no obvious depression or anxiety, speech and thought processing grossly intact  ASSESSMENT AND PLAN:  Discussed the following assessment and plan:  Personal history of covid-19  Personal history of covid-19 She is apprehensive about obtaining the COVID 19 vaccine in light of her previous infection and the serious adverse effects experienced by 3 people known to her. .  Advised her to consider having an antibody check  To confim existence on antural antibodies     I discussed the assessment and treatment plan with the patient. The patient was provided an opportunity to ask questions and all were answered. The patient agreed with the plan and demonstrated an understanding of the instructions.   The patient was advised to call back or seek an in-person evaluation if the symptoms worsen or if the condition fails to improve as anticipated.  I provided 30 minutes of non-face-to-face time during this encounter.   Crecencio Mc, MD

## 2020-07-17 NOTE — Assessment & Plan Note (Addendum)
She is apprehensive about obtaining the COVID 19 vaccine in light of her previous infection and the serious adverse effects experienced by 3 people known to her. .  Advised her to consider having an antibody check  To confim existence on antural antibodies

## 2020-09-07 ENCOUNTER — Telehealth: Payer: Self-pay | Admitting: Internal Medicine

## 2020-09-07 DIAGNOSIS — Z1152 Encounter for screening for COVID-19: Secondary | ICD-10-CM

## 2020-09-07 NOTE — Telephone Encounter (Signed)
Patient called in wanted to get a lab test for covid antibiotics need order for it.

## 2020-09-07 NOTE — Telephone Encounter (Signed)
Is this okay to order?  

## 2020-09-07 NOTE — Telephone Encounter (Signed)
Yes  You can order bc I don't know where she wants it done

## 2020-09-07 NOTE — Telephone Encounter (Signed)
Labs have been ordered and pt is scheduled for have them done here at the office next week.

## 2020-09-12 ENCOUNTER — Other Ambulatory Visit: Payer: Self-pay

## 2020-09-12 ENCOUNTER — Other Ambulatory Visit (INDEPENDENT_AMBULATORY_CARE_PROVIDER_SITE_OTHER): Payer: BLUE CROSS/BLUE SHIELD

## 2020-09-12 DIAGNOSIS — Z1152 Encounter for screening for COVID-19: Secondary | ICD-10-CM

## 2020-09-12 NOTE — Addendum Note (Signed)
Addended by: Leeanne Rio on: 09/12/2020 11:07 AM   Modules accepted: Orders

## 2020-09-13 LAB — SARS-COV-2 ANTIBODY, IGM: SARS-CoV-2 Antibody, IgM: NEGATIVE

## 2020-09-13 LAB — SARS-COV-2 SEMI-QUANTITATIVE TOTAL ANTIBODY, SPIKE
SARS-CoV-2 Semi-Quant Total Ab: 755.1 U/mL (ref <0.8)
SARS-CoV-2 Spike Ab Interp: POSITIVE

## 2020-09-13 LAB — SAR COV2 SEROLOGY (COVID19)AB(IGG),IA: DiaSorin SARS-CoV-2 Ab, IgG: POSITIVE

## 2020-09-14 NOTE — Progress Notes (Signed)
You have a  robust COVID antibody titre.  Regards,   Deborra Medina, MD

## 2022-03-29 DIAGNOSIS — H11001 Unspecified pterygium of right eye: Secondary | ICD-10-CM | POA: Diagnosis not present

## 2022-04-11 ENCOUNTER — Encounter: Payer: Self-pay | Admitting: Internal Medicine

## 2022-04-11 DIAGNOSIS — E559 Vitamin D deficiency, unspecified: Secondary | ICD-10-CM

## 2022-04-11 DIAGNOSIS — R5383 Other fatigue: Secondary | ICD-10-CM

## 2022-04-11 DIAGNOSIS — R7301 Impaired fasting glucose: Secondary | ICD-10-CM

## 2022-04-11 DIAGNOSIS — E785 Hyperlipidemia, unspecified: Secondary | ICD-10-CM

## 2022-04-11 NOTE — Telephone Encounter (Signed)
I have ordered normal physical lab orders and added the vitamin d and UA per pt request. Is there anything else that needs to be added?

## 2022-04-12 ENCOUNTER — Other Ambulatory Visit: Payer: Self-pay | Admitting: Internal Medicine

## 2022-04-17 ENCOUNTER — Other Ambulatory Visit (INDEPENDENT_AMBULATORY_CARE_PROVIDER_SITE_OTHER): Payer: 59

## 2022-04-17 VITALS — BP 134/94 | HR 78 | Temp 98.6°F | Wt 179.0 lb

## 2022-04-17 DIAGNOSIS — K760 Fatty (change of) liver, not elsewhere classified: Secondary | ICD-10-CM | POA: Diagnosis not present

## 2022-04-17 DIAGNOSIS — E785 Hyperlipidemia, unspecified: Secondary | ICD-10-CM | POA: Diagnosis not present

## 2022-04-17 DIAGNOSIS — Z6832 Body mass index (BMI) 32.0-32.9, adult: Secondary | ICD-10-CM | POA: Diagnosis not present

## 2022-04-17 DIAGNOSIS — R030 Elevated blood-pressure reading, without diagnosis of hypertension: Secondary | ICD-10-CM

## 2022-04-17 DIAGNOSIS — R5383 Other fatigue: Secondary | ICD-10-CM

## 2022-04-17 DIAGNOSIS — E6609 Other obesity due to excess calories: Secondary | ICD-10-CM

## 2022-04-17 DIAGNOSIS — Z0000 Encounter for general adult medical examination without abnormal findings: Secondary | ICD-10-CM

## 2022-04-17 DIAGNOSIS — E559 Vitamin D deficiency, unspecified: Secondary | ICD-10-CM

## 2022-04-17 DIAGNOSIS — R7301 Impaired fasting glucose: Secondary | ICD-10-CM

## 2022-04-17 LAB — LIPID PANEL
Cholesterol: 217 mg/dL — ABNORMAL HIGH (ref 0–200)
HDL: 65.3 mg/dL (ref >39.00)
LDL Cholesterol: 132 mg/dL — ABNORMAL HIGH (ref 0–99)
NonHDL: 151.31
Total CHOL/HDL Ratio: 3
Triglycerides: 96 mg/dL (ref 0.0–149.0)
VLDL: 19.2 mg/dL (ref 0.0–40.0)

## 2022-04-17 LAB — COMPREHENSIVE METABOLIC PANEL
ALT: 23 U/L (ref 0–35)
AST: 19 U/L (ref 0–37)
Albumin: 4.3 g/dL (ref 3.5–5.2)
Alkaline Phosphatase: 68 U/L (ref 39–117)
BUN: 14 mg/dL (ref 6–23)
CO2: 26 mEq/L (ref 19–32)
Calcium: 9.4 mg/dL (ref 8.4–10.5)
Chloride: 103 mEq/L (ref 96–112)
Creatinine, Ser: 0.85 mg/dL (ref 0.40–1.20)
GFR: 74.04 mL/min (ref >60.00)
Glucose, Bld: 103 mg/dL — ABNORMAL HIGH (ref 70–99)
Potassium: 3.6 mEq/L (ref 3.5–5.1)
Sodium: 137 mEq/L (ref 135–145)
Total Bilirubin: 0.4 mg/dL (ref 0.2–1.2)
Total Protein: 6.7 g/dL (ref 6.0–8.3)

## 2022-04-17 LAB — CBC WITH DIFFERENTIAL/PLATELET
Basophils Absolute: 0 10*3/uL (ref 0.0–0.1)
Basophils Relative: 0.9 % (ref 0.0–3.0)
Eosinophils Absolute: 0.1 10*3/uL (ref 0.0–0.7)
Eosinophils Relative: 2.4 % (ref 0.0–5.0)
HCT: 41.4 % (ref 36.0–46.0)
Hemoglobin: 13.8 g/dL (ref 12.0–15.0)
Lymphocytes Relative: 33.5 % (ref 12.0–46.0)
Lymphs Abs: 1.8 10*3/uL (ref 0.7–4.0)
MCHC: 33.3 g/dL (ref 30.0–36.0)
MCV: 91.6 fl (ref 78.0–100.0)
Monocytes Absolute: 0.6 10*3/uL (ref 0.1–1.0)
Monocytes Relative: 11.3 % (ref 3.0–12.0)
Neutro Abs: 2.8 10*3/uL (ref 1.4–7.7)
Neutrophils Relative %: 51.9 % (ref 43.0–77.0)
Platelets: 237 10*3/uL (ref 150.0–400.0)
RBC: 4.52 Mil/uL (ref 3.87–5.11)
RDW: 13.1 % (ref 11.5–15.5)
WBC: 5.3 10*3/uL (ref 4.0–10.5)

## 2022-04-17 LAB — URINALYSIS, ROUTINE W REFLEX MICROSCOPIC
Bilirubin Urine: NEGATIVE
Hgb urine dipstick: NEGATIVE
Ketones, ur: NEGATIVE
Nitrite: NEGATIVE
RBC / HPF: NONE SEEN (ref 0–?)
Specific Gravity, Urine: <=1.005 — AB (ref 1.000–1.030)
Total Protein, Urine: NEGATIVE
Urine Glucose: NEGATIVE
Urobilinogen, UA: 0.2 (ref 0.0–1.0)
pH: 6 (ref 5.0–8.0)

## 2022-04-17 LAB — MICROALBUMIN / CREATININE URINE RATIO
Creatinine,U: 54.3 mg/dL
Microalb Creat Ratio: 1.3 mg/g (ref 0.0–30.0)
Microalb, Ur: <0.7 mg/dL (ref 0.0–1.9)

## 2022-04-17 LAB — HEMOGLOBIN A1C: Hgb A1c MFr Bld: 5.7 % (ref 4.6–6.5)

## 2022-04-17 LAB — VITAMIN D 25 HYDROXY (VIT D DEFICIENCY, FRACTURES): VITD: 10.83 ng/mL — ABNORMAL LOW (ref 30.00–100.00)

## 2022-04-17 LAB — TSH: TSH: 6.01 u[IU]/mL — ABNORMAL HIGH (ref 0.35–5.50)

## 2022-04-18 ENCOUNTER — Encounter: Payer: BLUE CROSS/BLUE SHIELD | Admitting: Internal Medicine

## 2022-04-18 ENCOUNTER — Other Ambulatory Visit: Payer: Self-pay | Admitting: Internal Medicine

## 2022-04-18 DIAGNOSIS — N390 Urinary tract infection, site not specified: Secondary | ICD-10-CM

## 2022-04-19 ENCOUNTER — Other Ambulatory Visit (HOSPITAL_COMMUNITY)
Admission: RE | Admit: 2022-04-19 | Discharge: 2022-04-19 | Disposition: A | Discharge status: Home / Self Care | Payer: 59 | Source: Ambulatory Visit | Attending: Internal Medicine | Admitting: Internal Medicine

## 2022-04-19 ENCOUNTER — Encounter: Payer: Self-pay | Admitting: Internal Medicine

## 2022-04-19 ENCOUNTER — Ambulatory Visit (INDEPENDENT_AMBULATORY_CARE_PROVIDER_SITE_OTHER): Payer: 59 | Admitting: Internal Medicine

## 2022-04-19 VITALS — BP 134/94 | HR 78 | Temp 97.7°F | Ht 62.0 in | Wt 179.1 lb

## 2022-04-19 DIAGNOSIS — Z0000 Encounter for general adult medical examination without abnormal findings: Secondary | ICD-10-CM

## 2022-04-19 DIAGNOSIS — Z1231 Encounter for screening mammogram for malignant neoplasm of breast: Secondary | ICD-10-CM | POA: Diagnosis not present

## 2022-04-19 DIAGNOSIS — R030 Elevated blood-pressure reading, without diagnosis of hypertension: Secondary | ICD-10-CM | POA: Diagnosis not present

## 2022-04-19 DIAGNOSIS — K760 Fatty (change of) liver, not elsewhere classified: Secondary | ICD-10-CM | POA: Diagnosis not present

## 2022-04-19 DIAGNOSIS — Z8639 Personal history of other endocrine, nutritional and metabolic disease: Secondary | ICD-10-CM | POA: Diagnosis not present

## 2022-04-19 DIAGNOSIS — E559 Vitamin D deficiency, unspecified: Secondary | ICD-10-CM | POA: Diagnosis not present

## 2022-04-19 DIAGNOSIS — Z124 Encounter for screening for malignant neoplasm of cervix: Secondary | ICD-10-CM

## 2022-04-19 MED ORDER — ERGOCALCIFEROL 1.25 MG (50000 UT) PO CAPS: 50000.0000 [IU] | ORAL_CAPSULE | ORAL | 0 refills | Status: DC

## 2022-04-19 MED ORDER — ZOSTER VAC RECOMB ADJUVANTED 50 MCG/0.5ML IM SUSR: 0.5000 mL | Freq: Once | INTRAMUSCULAR | 1 refills | Status: AC

## 2022-04-19 NOTE — Progress Notes (Unsigned)
Patient ID: Marie Howard, female    DOB: 30-Apr-1961  Age: 61 y.o. MRN: [id number]  The patient is here for annual preventive examination and management of other chronic and acute problems.   The risk factors are reflected in the social history.  The roster of all physicians providing medical care to patient - is listed in the Snapshot section of the chart.  Activities of daily living:  The patient is 100% independent in all ADLs: dressing, toileting, feeding as well as independent mobility  Home safety : The patient has smoke detectors in the home. They wear seatbelts.  There are no firearms at home. There is no violence in the home.   There is no risks for hepatitis, STDs or HIV. There is no   history of blood transfusion. They have no travel history to infectious disease endemic areas of the world.  The patient has seen their dentist in the last six month. They have seen their eye doctor in the last year. They admit to slight hearing difficulty with regard to whispered voices and some television programs.  They have deferred audiologic testing in the last year.  They do not  have excessive sun exposure. Discussed the need for sun protection: hats, long sleeves and use of sunscreen if there is significant sun exposure.   Diet: the importance of a healthy diet is discussed. They do have a healthy diet.  The benefits of regular aerobic exercise were discussed. She exercises  several  times per week ,  20 minutes.   Depression screen: there are no signs or vegative symptoms of depression- irritability, change in appetite, anhedonia, sadness/tearfullness.  Cognitive assessment: the patient manages all their financial and personal affairs and is actively engaged. They could relate day,date,year and events; recalled 2/3 objects at 3 minutes; performed clock-face test normally.  The following portions of the patient's history were reviewed and updated as appropriate: allergies, current medications,  past family history, past medical history,  past surgical history, past social history  and problem list.  Visual acuity was not assessed per patient preference since she has regular follow up with her ophthalmologist. Hearing and body mass index were assessed and reviewed.   During the course of the visit the patient was educated and counseled about appropriate screening and preventive services including : fall prevention , diabetes screening, nutrition counseling, colorectal cancer screening, and recommended immunizations.    CC: The primary encounter diagnosis was Screening for cervical cancer. Diagnoses of Encounter for screening mammogram for malignant neoplasm of breast, Encounter for preventive health examination, Hepatic steatosis, History of hypothyroidism, Vitamin D deficiency, and Elevated blood pressure reading without diagnosis of hypertension were also pertinent to this visit.  1)  recurrent LLQ pain,  not occurring daily ,  but several times per week.  Described as a dull ache . Not related to bowel changes.    History Marie Howard has a past medical history of Arthritis, GERD (gastroesophageal reflux disease), History of chicken pox, and Hyperlipidemia.   She has a past surgical history that includes Colonoscopy with propofol (N/A, 07/02/2018).   Her family history includes Arthritis in her father and mother; Cancer in her paternal grandfather; Heart disease in her paternal grandmother; Heart disease (age of onset: 13) in her father; Hyperlipidemia in her father, mother, and paternal grandmother; Hypertension in her father and mother; Stroke in her paternal grandmother.She reports that she has never smoked. She has never used smokeless tobacco. She reports current alcohol use. She reports that  she does not use drugs.  Outpatient Medications Prior to Visit  Medication Sig Dispense Refill   cholecalciferol (VITAMIN D) 1000 units tablet Take 1,000 Units by mouth daily. (Patient not taking:  Reported on 04/19/2022)     Probiotic Product (PROBIOTIC-10 PO) Take by mouth. (Patient not taking: Reported on 04/19/2022)     triamcinolone cream (KENALOG) 0.1 % Apply 1 application 2 (two) times daily topically. (Patient not taking: Reported on 04/19/2022) 30 g 0   No facility-administered medications prior to visit.    Review of Systems  Objective:  BP (!) 134/94 (BP Location: Left Arm, Patient Position: Sitting, Cuff Size: Normal)   Pulse 78   Temp 97.7 F (36.5 C) (Oral)   Ht '5\' 2"'$  (1.575 m)   Wt 179 lb 1.9 oz (81.2 kg)   SpO2 99%   BMI 32.76 kg/m   Physical Exam  Physical Exam   Assessment & Plan:   Problem List Items Addressed This Visit     Elevated blood pressure reading without diagnosis of hypertension    shee has no prior history of hypertension. she will check his blood pressure several times over the next 3-4 weeks and to submit readings for evaluation.       Encounter for preventive health examination    age appropriate education and counseling updated, referrals for preventative services and immunizations addressed, dietary and smoking counseling addressed, most recent labs reviewed.  I have personally reviewed and have noted:   1) the patient's medical and social history 2) The pt's use of alcohol, tobacco, and illicit drugs 3) The patient's current medications and supplements 4) Functional ability including ADL's, fall risk, home safety risk, hearing and visual impairment 5) Diet and physical activities 6) Evidence for depression or mood disorder 7) The patient's height, weight, and BMI have been recorded in the chart     I have made referrals, and provided counseling and education based on review of the above      Hepatic steatosis   History of hypothyroidism    Recommending that she start  levothyroxine for elevated TSH with weight gain noted. She prefers to weight         Relevant Orders   TSH   Vitamin D deficiency   Relevant Orders   VITAMIN  D 25 Hydroxy (Vit-D Deficiency, Fractures)   Other Visit Diagnoses     Screening for cervical cancer    -  Primary   Relevant Orders   Cytology - PAP( [place])   Encounter for screening mammogram for malignant neoplasm of breast       Relevant Orders   MM 3D SCREEN BREAST BILATERAL       I have discontinued Younique W. Puertas's triamcinolone cream and Probiotic Product (PROBIOTIC-10 PO). I am also having her start on Zoster Vaccine Adjuvanted and ergocalciferol.  Meds ordered this encounter  Medications   Zoster Vaccine Adjuvanted [facility]) injection    Sig: Inject 0.5 mLs into the muscle once for 1 dose.    Dispense:  1 each    Refill:  1   ergocalciferol (DRISDOL) 1.25 MG (50000 UT) capsule    Sig: Take 1 capsule (50,000 Units total) by mouth once a week.    Dispense:  12 capsule    Refill:  0    Medications Discontinued During This Encounter  Medication Reason   Probiotic Product (PROBIOTIC-10 PO)    triamcinolone cream (KENALOG) 0.1 %     Follow-up: No follow-ups on file.  Crecencio Mc, MD

## 2022-04-19 NOTE — Assessment & Plan Note (Signed)
Recommending that she start  levothyroxine for elevated TSH with weight gain noted. She prefers to weight

## 2022-04-19 NOTE — Patient Instructions (Addendum)
  Your vitamin D is low .   I am calling in a megadose of Vit D to take once weekly for a total of 3 months.  After you finish the weekly Vitamin D supplement, you should start taking an OTC  Vit D3 supplement 2000 units daily  Please keep track of the left sided abdominal pain you have been experiencing:  1) how frequently? 2) how long does it last? 3) is it associated with a change in Bowel movements?   Return in 3 months for repeat thyroid and vitamin D levels  Shingles vaccine recommended.  Take rx to pharmacy

## 2022-04-21 NOTE — Assessment & Plan Note (Signed)
she has no prior history of hypertension. He will check his blood pressure several times over the next 3-4 weeks and to submit readings for evaluation. Urine microalbumin to creatinine ratio done today is normal.  Lab Results  Component Value Date   LABMICR Comment 04/04/2020   LABMICR Comment 09/24/2017   MICROALBUR <0.7 04/17/2022

## 2022-04-21 NOTE — Assessment & Plan Note (Signed)

## 2022-04-21 NOTE — Assessment & Plan Note (Signed)
shee has no prior history of hypertension. she will check his blood pressure several times over the next 3-4 weeks and to submit readings for evaluation.

## 2022-04-21 NOTE — Assessment & Plan Note (Signed)
ALT  Is no longer elevated  Lab Results  Component Value Date   ALT 23 04/17/2022   AST 19 04/17/2022   ALKPHOS 68 04/17/2022   BILITOT 0.4 04/17/2022

## 2022-04-21 NOTE — Progress Notes (Signed)
Patient ID: Marie Howard, female    DOB: 09-20-1961  Age: 61 y.o. MRN: [id number]  The patient is here for annual preventive  examination and management of other chronic and acute problems.   The risk factors are reflected in the social history.  The roster of all physicians providing medical care to patient - is listed in the Snapshot section of the chart.  Activities of daily living:  The patient is 100% independent in all ADLs: dressing, toileting, feeding as well as independent mobility  Home safety : The patient has smoke detectors in the home. They wear seatbelts.  There are no firearms at home. There is no violence in the home.   There is no risks for hepatitis, STDs or HIV. There is no   history of blood transfusion. They have no travel history to infectious disease endemic areas of the world.  The patient has seen their dentist in the last six month. They have seen their eye doctor in the last year. They admit to slight hearing difficulty with regard to whispered voices and some television programs.  They have deferred audiologic testing in the last year.  They do not  have excessive sun exposure. Discussed the need for sun protection: hats, long sleeves and use of sunscreen if there is significant sun exposure.   Diet: the importance of a healthy diet is discussed. They do have a healthy diet.  The benefits of regular aerobic exercise were discussed. She walks 3 times per week ,  60 minutes.   Depression screen: there are no signs or vegative symptoms of depression- irritability, change in appetite, anhedonia, sadness/tearfullness.  Cognitive assessment: the patient manages all their financial and personal affairs and is actively engaged. They could relate day,date,year and events; recalled 2/3 objects at 3 minutes; performed clock-face test normally.  The following portions of the patient's history were reviewed and updated as appropriate: allergies, current medications, past  family history, past medical history,  past surgical history, past social history  and problem list.  Visual acuity was not assessed per patient preference since she has regular follow up with her ophthalmologist. Hearing and body mass index were assessed and reviewed.   During the course of the visit the patient was educated and counseled about appropriate screening and preventive services including : fall prevention , diabetes screening, nutrition counseling, colorectal cancer screening, and recommended immunizations.    CC: Diagnoses of Vitamin D deficiency, Other fatigue, Impaired fasting glucose, Hyperlipidemia, unspecified hyperlipidemia type, Encounter for preventive health examination, Elevated blood pressure reading without diagnosis of hypertension, Hepatic steatosis, and Class 1 obesity due to excess calories with serious comorbidity and body mass index (BMI) of 32.0 to 32.9 in adult were pertinent to this visit.  History Marie Howard has a past medical history of Arthritis, GERD (gastroesophageal reflux disease), History of chicken pox, and Hyperlipidemia.   She has a past surgical history that includes Colonoscopy with propofol (N/A, 07/02/2018).   Her family history includes Arthritis in her father and mother; Cancer in her paternal grandfather; Heart disease in her paternal grandmother; Heart disease (age of onset: 73) in her father; Hyperlipidemia in her father, mother, and paternal grandmother; Hypertension in her father and mother; Stroke in her paternal grandmother.She reports that she has never smoked. She has never used smokeless tobacco. She reports current alcohol use. She reports that she does not use drugs.  Outpatient Medications Prior to Visit  Medication Sig Dispense Refill   cholecalciferol (VITAMIN D) 1000 units tablet  Take 1,000 Units by mouth daily. (Patient not taking: Reported on 04/19/2022)     Probiotic Product (PROBIOTIC-10 PO) Take by mouth. (Patient not taking:  Reported on 04/19/2022)     triamcinolone cream (KENALOG) 0.1 % Apply 1 application 2 (two) times daily topically. (Patient not taking: Reported on 04/19/2022) 30 g 0   No facility-administered medications prior to visit.    Review of Systems  Patient denies headache, fevers, malaise, unintentional weight loss, skin rash, eye pain, sinus congestion and sinus pain, sore throat, dysphagia,  hemoptysis , cough, dyspnea, wheezing, chest pain, palpitations, orthopnea, edema, abdominal pain, nausea, melena, diarrhea, constipation, flank pain, dysuria, hematuria, urinary  Frequency, nocturia, numbness, tingling, seizures,  Focal weakness, Loss of consciousness,  Tremor, insomnia, depression, anxiety, and suicidal ideation.     Objective:  BP (!) 134/94   Pulse 78   Temp 98.6 F (37 C)   Wt 179 lb (81.2 kg)   BMI 32.74 kg/m   Physical Exam  General appearance: alert, cooperative and appears stated age Head: Normocephalic, without obvious abnormality, atraumatic Eyes: conjunctivae/corneas clear. PERRL, EOM's intact. Fundi benign. Ears: normal TM's and external ear canals both ears Nose: Nares normal. Septum midline. Mucosa normal. No drainage or sinus tenderness. Throat: lips, mucosa, and tongue normal; teeth and gums normal Neck: no adenopathy, no carotid bruit, no JVD, supple, symmetrical, trachea midline and thyroid not enlarged, symmetric, no tenderness/mass/nodules Lungs: clear to auscultation bilaterally Breasts: normal appearance, no masses or tenderness Heart: regular rate and rhythm, S1, S2 normal, no murmur, click, rub or gallop Abdomen: soft, non-tender; bowel sounds normal; no masses,  no organomegaly Extremities: extremities normal, atraumatic, no cyanosis or edema Pulses: 2+ and symmetric Skin: Skin color, texture, turgor normal. No rashes or lesions Neurologic: Alert and oriented X 3, normal strength and tone. Normal symmetric reflexes. Normal coordination and gait.       Assessment & Plan:   Problem List Items Addressed This Visit     Elevated blood pressure reading without diagnosis of hypertension    she has no prior history of hypertension. He will check his blood pressure several times over the next 3-4 weeks and to submit readings for evaluation. Urine microalbumin to creatinine ratio done today is normal.  Lab Results  Component Value Date   LABMICR Comment 04/04/2020   LABMICR Comment 09/24/2017   MICROALBUR <0.7 04/17/2022          Encounter for preventive health examination    age appropriate education and counseling updated, referrals for preventative services and immunizations addressed, dietary and smoking counseling addressed, most recent labs reviewed.  I have personally reviewed and have noted:   1) the patient's medical and social history 2) The pt's use of alcohol, tobacco, and illicit drugs 3) The patient's current medications and supplements 4) Functional ability including ADL's, fall risk, home safety risk, hearing and visual impairment 5) Diet and physical activities 6) Evidence for depression or mood disorder 7) The patient's height, weight, and BMI have been recorded in the chart.   I have made referrals, and provided counseling and education based on review of the above      Hepatic steatosis    ALT  Is no longer elevated  Lab Results  Component Value Date   ALT 23 04/17/2022   AST 19 04/17/2022   ALKPHOS 68 04/17/2022   BILITOT 0.4 04/17/2022         Obesity    I have addressed  BMI and recommended wt loss  of 10% of body weight over the next 6 months using a low glycemic index diet and regular exercise a minimum of 5 days per week.         Vitamin D deficiency    Recurrent, DUE TO SUN AVOIDANCE.  Resume megadose supplementation       Other Visit Diagnoses     Other fatigue       Impaired fasting glucose       Hyperlipidemia, unspecified hyperlipidemia type           I have discontinued  Emalyn Schou. Mutch's cholecalciferol.  No orders of the defined types were placed in this encounter.   Medications Discontinued During This Encounter  Medication Reason   cholecalciferol (VITAMIN D) 1000 units tablet     Follow-up: No follow-ups on file.   Crecencio Mc, MD

## 2022-04-21 NOTE — Addendum Note (Signed)
Addended by: Crecencio Mc on: 04/21/2022 01:45 PM   Modules accepted: Orders, Level of Service

## 2022-04-21 NOTE — Assessment & Plan Note (Signed)
Recurrent, DUE TO SUN AVOIDANCE.  Resume megadose supplementation

## 2022-04-21 NOTE — Assessment & Plan Note (Signed)
I have addressed  BMI and recommended wt loss of 10% of body weight over the next 6 months using a low glycemic index diet and regular exercise a minimum of 5 days per week.   

## 2022-04-23 LAB — CYTOLOGY - PAP
Comment: NEGATIVE
Diagnosis: NEGATIVE
High risk HPV: NEGATIVE

## 2022-05-03 DIAGNOSIS — H11021 Central pterygium of right eye: Secondary | ICD-10-CM | POA: Diagnosis not present

## 2022-06-04 ENCOUNTER — Other Ambulatory Visit: Payer: Self-pay | Admitting: Internal Medicine

## 2022-06-04 NOTE — Telephone Encounter (Signed)
Refilled 04/19/22 Last OV 04/19/22  Next OV 04/24/23 Last Vit D Level 04/17/22 Abnormal 10.83

## 2022-07-10 ENCOUNTER — Ambulatory Visit
Admission: RE | Admit: 2022-07-10 | Discharge: 2022-07-10 | Disposition: A | Discharge status: Home / Self Care | Payer: 59 | Source: Ambulatory Visit | Attending: Internal Medicine | Admitting: Internal Medicine

## 2022-07-10 DIAGNOSIS — Z1231 Encounter for screening mammogram for malignant neoplasm of breast: Secondary | ICD-10-CM | POA: Diagnosis not present

## 2022-08-22 DIAGNOSIS — H11001 Unspecified pterygium of right eye: Secondary | ICD-10-CM | POA: Diagnosis not present

## 2022-08-22 DIAGNOSIS — H11051 Peripheral pterygium, progressive, right eye: Secondary | ICD-10-CM | POA: Diagnosis not present

## 2022-08-22 DIAGNOSIS — K219 Gastro-esophageal reflux disease without esophagitis: Secondary | ICD-10-CM | POA: Diagnosis not present

## 2022-11-20 DIAGNOSIS — H11021 Central pterygium of right eye: Secondary | ICD-10-CM | POA: Diagnosis not present

## 2022-12-05 ENCOUNTER — Encounter: Payer: Self-pay | Admitting: Internal Medicine

## 2022-12-07 ENCOUNTER — Telehealth: Payer: 59 | Admitting: Internal Medicine

## 2022-12-07 ENCOUNTER — Encounter: Payer: Self-pay | Admitting: Internal Medicine

## 2022-12-07 VITALS — Ht 62.0 in | Wt 179.0 lb

## 2022-12-07 DIAGNOSIS — R5383 Other fatigue: Secondary | ICD-10-CM

## 2022-12-07 NOTE — Progress Notes (Signed)
Virtual Visit via [facility]   Note   This format is felt to be most appropriate for this patient at this time.  All issues noted in this document were discussed and addressed.  No physical exam was performed (except for noted visual exam findings with Video Visits).   I connected with Marie Howard  on 12/07/22 at  8:00 AM EST by a video enabled telemedicine application  and verified that I am speaking with the correct person using two identifiers. Location patient: home Location provider: work or home office Persons participating in the virtual visit: patient, provider  I discussed the limitations, risks, security and privacy concerns of performing an evaluation and management service by telephone and the availability of in person appointments. I also discussed with the patient that there may be a patient responsible charge related to this service. The patient expressed understanding and agreed to proceed.  Reason for visit: increased anxiety /caregiver fatigue  HPI:  62 yr old female presents with increased anxiety triggered by her parents health issues and most notably, her mother's recent difficulties in managing the increased responsibility of caring for patient's father.  Patient does not have healthcare POA for either parent.   Patient is concerned that her mother 's mental status has deteriorated and that there is something seriously wrong with her that has not been found yet.  Patient is not sleeping well, trying to balance working full time with supervising parents.  Not able to find their medications, worried they are taking them wrong.   ROS: See pertinent positives and negatives per HPI.  Past Medical History:  Diagnosis Date   Arthritis    GERD (gastroesophageal reflux disease)    History of chicken pox    Hyperlipidemia     Past Surgical History:  Procedure Laterality Date   COLONOSCOPY WITH PROPOFOL N/A 07/02/2018   Procedure: COLONOSCOPY WITH PROPOFOL;  Surgeon: Robert Bellow, MD;  Location: [facility] ENDOSCOPY;  Service: Endoscopy;  Laterality: N/A;    Family History  Problem Relation Age of Onset   Arthritis Mother    Hyperlipidemia Mother    Hypertension Mother    Arthritis Father    Hyperlipidemia Father    Heart disease Father 30       CAD s/p 2 vessel CABG    Hypertension Father    Heart disease Paternal Grandmother    Hyperlipidemia Paternal Grandmother    Stroke Paternal Grandmother    Cancer Paternal Grandfather        colon    SOCIAL HX:  reports that she has never smoked. She has never used smokeless tobacco. She reports current alcohol use. She reports that she does not use drugs.    Current Outpatient Medications:    Vitamin D, Ergocalciferol, (DRISDOL) 1.25 MG (50000 UNIT) CAPS capsule, TAKE 1 CAPSULE BY MOUTH ONE TIME PER WEEK, Disp: 4 capsule, Rfl: 5  EXAM:  VITALS per patient if applicable:  GENERAL: alert, oriented, appears well and in no acute distress  HEENT: atraumatic, conjunttiva clear, no obvious abnormalities on inspection of external nose and ears  NECK: normal movements of the head and neck  LUNGS: on inspection no signs of respiratory distress, breathing rate appears normal, no obvious gross SOB, gasping or wheezing  CV: no obvious cyanosis  MS: moves all visible extremities without noticeable abnormality  PSYCH/NEURO: pleasant and cooperative, no obvious depression or anxiety, speech and thought processing grossly intact  ASSESSMENT AND PLAN: Caregiver with fatigue Assessment & Plan: Reviewed her methods  of coping with her increased stress .  Patient  has been having some trouble sleeping.  Wakes up 2 to 3 times per night . Bedtime hygiene reviewed,  Patient has been using an electronic book to read before bed.  Does not drink caffeinated beverages after 3 PM.   Patient does have a history of anxiety but does not want to start medication at this time.        I discussed the assessment and treatment plan  with the patient. The patient was provided an opportunity to ask questions and all were answered. The patient agreed with the plan and demonstrated an understanding of the instructions.   The patient was advised to call back or seek an in-person evaluation if the symptoms worsen or if the condition fails to improve as anticipated.   I spent 30 minutes dedicated to the care of this patient on the date of this encounter to include pre-visit review of her medical history,  Face-to-face time with the patient , counselling on her management of her mother's symptoms and post visit ordering of testing and therapeutics.    Crecencio Mc, MD

## 2022-12-09 DIAGNOSIS — R5383 Other fatigue: Secondary | ICD-10-CM | POA: Insufficient documentation

## 2022-12-09 NOTE — Assessment & Plan Note (Signed)
Reviewed her methods of coping with her increased stress .  Patient  has been having some trouble sleeping.  Wakes up 2 to 3 times per night . Bedtime hygiene reviewed,  Patient has been using an electronic book to read before bed.  Does not drink caffeinated beverages after 3 PM.   Patient does have a history of anxiety but does not want to start medication at this time.

## 2023-01-12 ENCOUNTER — Encounter: Payer: Self-pay | Admitting: Internal Medicine

## 2023-01-15 ENCOUNTER — Encounter: Payer: Self-pay | Admitting: Internal Medicine

## 2023-04-10 ENCOUNTER — Encounter: Payer: Self-pay | Admitting: Internal Medicine

## 2023-04-10 DIAGNOSIS — E559 Vitamin D deficiency, unspecified: Secondary | ICD-10-CM

## 2023-04-10 DIAGNOSIS — Z8639 Personal history of other endocrine, nutritional and metabolic disease: Secondary | ICD-10-CM

## 2023-04-10 DIAGNOSIS — R7301 Impaired fasting glucose: Secondary | ICD-10-CM

## 2023-04-10 DIAGNOSIS — E785 Hyperlipidemia, unspecified: Secondary | ICD-10-CM

## 2023-04-10 DIAGNOSIS — R5383 Other fatigue: Secondary | ICD-10-CM

## 2023-04-19 ENCOUNTER — Other Ambulatory Visit (INDEPENDENT_AMBULATORY_CARE_PROVIDER_SITE_OTHER): Payer: 59

## 2023-04-19 DIAGNOSIS — E785 Hyperlipidemia, unspecified: Secondary | ICD-10-CM

## 2023-04-19 DIAGNOSIS — R7301 Impaired fasting glucose: Secondary | ICD-10-CM | POA: Diagnosis not present

## 2023-04-19 DIAGNOSIS — Z8639 Personal history of other endocrine, nutritional and metabolic disease: Secondary | ICD-10-CM | POA: Diagnosis not present

## 2023-04-19 DIAGNOSIS — E559 Vitamin D deficiency, unspecified: Secondary | ICD-10-CM

## 2023-04-19 DIAGNOSIS — R5383 Other fatigue: Secondary | ICD-10-CM

## 2023-04-19 LAB — CBC WITH DIFFERENTIAL/PLATELET
Basophils Absolute: 0 10*3/uL (ref 0.0–0.1)
Basophils Relative: 0.7 % (ref 0.0–3.0)
Eosinophils Absolute: 0.1 10*3/uL (ref 0.0–0.7)
Eosinophils Relative: 1.1 % (ref 0.0–5.0)
HCT: 42.6 % (ref 36.0–46.0)
Hemoglobin: 14 g/dL (ref 12.0–15.0)
Lymphocytes Relative: 27.6 % (ref 12.0–46.0)
Lymphs Abs: 1.6 10*3/uL (ref 0.7–4.0)
MCHC: 32.8 g/dL (ref 30.0–36.0)
MCV: 93 fl (ref 78.0–100.0)
Monocytes Absolute: 0.5 10*3/uL (ref 0.1–1.0)
Monocytes Relative: 9.1 % (ref 3.0–12.0)
Neutro Abs: 3.5 10*3/uL (ref 1.4–7.7)
Neutrophils Relative %: 61.5 % (ref 43.0–77.0)
Platelets: 256 10*3/uL (ref 150.0–400.0)
RBC: 4.58 Mil/uL (ref 3.87–5.11)
RDW: 13.1 % (ref 11.5–15.5)
WBC: 5.7 10*3/uL (ref 4.0–10.5)

## 2023-04-19 LAB — LIPID PANEL
Cholesterol: 230 mg/dL — ABNORMAL HIGH (ref 0–200)
HDL: 72.2 mg/dL (ref >39.00)
LDL Cholesterol: 145 mg/dL — ABNORMAL HIGH (ref 0–99)
NonHDL: 157.45
Total CHOL/HDL Ratio: 3
Triglycerides: 62 mg/dL (ref 0.0–149.0)
VLDL: 12.4 mg/dL (ref 0.0–40.0)

## 2023-04-19 LAB — COMPREHENSIVE METABOLIC PANEL
ALT: 25 U/L (ref 0–35)
AST: 24 U/L (ref 0–37)
Albumin: 4.3 g/dL (ref 3.5–5.2)
Alkaline Phosphatase: 62 U/L (ref 39–117)
BUN: 17 mg/dL (ref 6–23)
CO2: 28 mEq/L (ref 19–32)
Calcium: 9.4 mg/dL (ref 8.4–10.5)
Chloride: 105 mEq/L (ref 96–112)
Creatinine, Ser: 0.89 mg/dL (ref 0.40–1.20)
GFR: 69.57 mL/min (ref >60.00)
Glucose, Bld: 90 mg/dL (ref 70–99)
Potassium: 4.1 mEq/L (ref 3.5–5.1)
Sodium: 138 mEq/L (ref 135–145)
Total Bilirubin: 0.4 mg/dL (ref 0.2–1.2)
Total Protein: 7.3 g/dL (ref 6.0–8.3)

## 2023-04-19 LAB — VITAMIN D 25 HYDROXY (VIT D DEFICIENCY, FRACTURES): VITD: 24.95 ng/mL — ABNORMAL LOW (ref 30.00–100.00)

## 2023-04-19 LAB — HEMOGLOBIN A1C: Hgb A1c MFr Bld: 5.8 % (ref 4.6–6.5)

## 2023-04-19 LAB — LDL CHOLESTEROL, DIRECT: Direct LDL: 145 mg/dL

## 2023-04-19 LAB — TSH: TSH: 2.73 u[IU]/mL (ref 0.35–5.50)

## 2023-04-24 ENCOUNTER — Ambulatory Visit (INDEPENDENT_AMBULATORY_CARE_PROVIDER_SITE_OTHER): Payer: 59 | Admitting: Internal Medicine

## 2023-04-24 ENCOUNTER — Encounter: Payer: Self-pay | Admitting: Internal Medicine

## 2023-04-24 VITALS — BP 130/80 | HR 80 | Temp 97.5°F | Resp 17 | Ht 62.5 in | Wt 182.0 lb

## 2023-04-24 DIAGNOSIS — Z0000 Encounter for general adult medical examination without abnormal findings: Secondary | ICD-10-CM

## 2023-04-24 DIAGNOSIS — E785 Hyperlipidemia, unspecified: Secondary | ICD-10-CM | POA: Diagnosis not present

## 2023-04-24 DIAGNOSIS — Z1231 Encounter for screening mammogram for malignant neoplasm of breast: Secondary | ICD-10-CM | POA: Diagnosis not present

## 2023-04-24 NOTE — Assessment & Plan Note (Signed)
10 yr irisk using the AHA cardiac RC is <4%   Lab Results  Component Value Date   CHOL 230 (H) 04/19/2023   HDL 72.20 04/19/2023   LDLCALC 145 (H) 04/19/2023   LDLDIRECT 145.0 04/19/2023   TRIG 62.0 04/19/2023   CHOLHDL 3 04/19/2023

## 2023-04-24 NOTE — Progress Notes (Signed)
Patient ID: Marie Howard, female    DOB: 30-Nov-1960  Age: 62 y.o. MRN: [id number]  The patient is here for annual preventive examination and management of other chronic and acute problems.   The risk factors are reflected in the social history.   The roster of all physicians providing medical care to patient - is listed in the Snapshot section of the chart.   Activities of daily living:  The patient is 100% independent in all ADLs: dressing, toileting, feeding as well as independent mobility   Home safety : The patient has smoke detectors in the home. They wear seatbelts.  There are no unsecured firearms at home. There is no violence in the home.    There is no risks for hepatitis, STDs or HIV. There is no   history of blood transfusion. They have no travel history to infectious disease endemic areas of the world.   The patient has seen their dentist in the last six month. They have seen their eye doctor in the last year. The patinet  denies slight hearing difficulty with regard to whispered voices and some television programs.  They have deferred audiologic testing in the last year.  They do not  have excessive sun exposure. Discussed the need for sun protection: hats, long sleeves and use of sunscreen if there is significant sun exposure.    Diet: the importance of a healthy diet is discussed. They do have a healthy diet.   The benefits of regular aerobic exercise were discussed. The patient  exercises  3 to 5 days per week  for  60 minutes.    Depression screen: there are no signs or vegative symptoms of depression- irritability, change in appetite, anhedonia, sadness/tearfullness.   The following portions of the patient's history were reviewed and updated as appropriate: allergies, current medications, past family history, past medical history,  past surgical history, past social history  and problem list.   Visual acuity was not assessed per patient preference since the patient has  regular follow up with an  ophthalmologist. Hearing and body mass index were assessed and reviewed.    During the course of the visit the patient was educated and counseled about appropriate screening and preventive services including : fall prevention , diabetes screening, nutrition counseling, colorectal cancer screening, and recommended immunizations.    Chief Complaint:  Eye exam yesterday,  bifocals recommended,  early cataracts (Dr Bayard Beaver in [place]) had pterygium removed last October. [facility]   Positive depression screen :  aggravated by mother's  personality change attributed to  apathy/depression aggravated by her father's dementia .  She keeps her grandchildren 3 days per week,  and looks afer her parents .  Feels more tired than 5 yrs ago (retired from [employer]).  Not exercising,  stress eating      Review of Symptoms  Patient denies headache, fevers, malaise, unintentional weight loss, skin rash, eye pain, sinus congestion and sinus pain, sore throat, dysphagia,  hemoptysis , cough, dyspnea, wheezing, chest pain, palpitations, orthopnea, edema, abdominal pain, nausea, melena, diarrhea, constipation, flank pain, dysuria, hematuria, urinary  Frequency, nocturia, numbness, tingling, seizures,  Focal weakness, Loss of consciousness,  Tremor, insomnia, depression, anxiety, and suicidal ideation.    Physical Exam:  BP 130/80   Pulse 80   Temp (!) 97.5 F (36.4 C) (Oral)   Resp 17   Ht 5' 2.5" (1.588 m)   Wt 182 lb (82.6 kg)   SpO2 99%   BMI 32.76 kg/m  Physical Exam Vitals reviewed.  Constitutional:      General: She is not in acute distress.    Appearance: Normal appearance. She is well-developed and normal weight. She is not ill-appearing, toxic-appearing or diaphoretic.  HENT:     Head: Normocephalic.     Right Ear: Tympanic membrane, ear canal and external ear normal. There is no impacted cerumen.     Left Ear: Tympanic membrane, ear canal and external ear normal.  There is no impacted cerumen.     Nose: Nose normal.     Mouth/Throat:     Mouth: Mucous membranes are moist.     Pharynx: Oropharynx is clear.  Eyes:     General: No scleral icterus.       Right eye: No discharge.        Left eye: No discharge.     Conjunctiva/sclera: Conjunctivae normal.     Pupils: Pupils are equal, round, and reactive to light.  Neck:     Thyroid: No thyromegaly.     Vascular: No carotid bruit or JVD.  Cardiovascular:     Rate and Rhythm: Normal rate and regular rhythm.     Heart sounds: Normal heart sounds.  Pulmonary:     Effort: Pulmonary effort is normal. No respiratory distress.     Breath sounds: Normal breath sounds.  Chest:  Breasts:    Breasts are symmetrical.     Right: Normal. No swelling, inverted nipple, mass, nipple discharge, skin change or tenderness.     Left: Normal. No swelling, inverted nipple, mass, nipple discharge, skin change or tenderness.  Abdominal:     General: Bowel sounds are normal.     Palpations: Abdomen is soft. There is no mass.     Tenderness: There is no abdominal tenderness. There is no guarding or rebound.  Musculoskeletal:        General: Normal range of motion.     Cervical back: Normal range of motion and neck supple.  Lymphadenopathy:     Cervical: No cervical adenopathy.     Upper Body:     Right upper body: No supraclavicular, axillary or pectoral adenopathy.     Left upper body: No supraclavicular, axillary or pectoral adenopathy.  Skin:    General: Skin is warm and dry.  Neurological:     General: No focal deficit present.     Mental Status: She is alert and oriented to person, place, and time. Mental status is at baseline.  Psychiatric:        Mood and Affect: Mood normal.        Behavior: Behavior normal.        Thought Content: Thought content normal.        Judgment: Judgment normal.    Assessment and Plan: Encounter for screening mammogram for malignant neoplasm of breast -     3D Screening  Mammogram, Left and Right; Future  Encounter for preventive health examination Assessment & Plan: age appropriate education and counseling updated, referrals for preventative services and immunizations addressed, dietary and smoking counseling addressed, most recent labs reviewed.  I have personally reviewed and have noted:   1) the patient's medical and social history 2) The pt's use of alcohol, tobacco, and illicit drugs 3) The patient's current medications and supplements 4) Functional ability including ADL's, fall risk, home safety risk, hearing and visual impairment 5) Diet and physical activities 6) Evidence for depression or mood disorder 7) The patient's height, weight, and BMI have been recorded in  the chart 8) I have ordered and reviewed a 12 lead EKG and find that there are no acute changes and patient is in sinus rhythm.     I have made referrals, and provided counseling and education based on review of the above    Hyperlipidemia with target LDL less than 160 Assessment & Plan: 10 yr irisk using the AHA cardiac RC is <4%   Lab Results  Component Value Date   CHOL 230 (H) 04/19/2023   HDL 72.20 04/19/2023   LDLCALC 145 (H) 04/19/2023   LDLDIRECT 145.0 04/19/2023   TRIG 62.0 04/19/2023   CHOLHDL 3 04/19/2023        No follow-ups on file.  Sherlene Shams, MD

## 2023-04-24 NOTE — Patient Instructions (Addendum)
Caregiver fatigue is REAL.  Start looking for someone reliable who can spend a few hours per week with your Dad so you and Mom can get a break   Your CBC,  A1c,  cholesterol,  liver and kidney function have been reviewed and there are no significant or worrisome findings .Using the American Heart Association Cardiac Risk Calculator,  your ten year risk for cardiac events is very low .    Continue vitamin D 2000 IUs daily  (or whenever you remember ,  up to 10,000 IU's  per dose )    Your annual mammogram has been ordered A  [facility] will not allow [place] to schedule it for you,  so please  call to make your appointment [phone]

## 2023-04-24 NOTE — Assessment & Plan Note (Signed)

## 2023-04-27 ENCOUNTER — Encounter: Payer: Self-pay | Admitting: Internal Medicine

## 2023-11-28 ENCOUNTER — Ambulatory Visit (INDEPENDENT_AMBULATORY_CARE_PROVIDER_SITE_OTHER): Payer: 59 | Admitting: Internal Medicine

## 2023-11-28 ENCOUNTER — Encounter: Payer: Self-pay | Admitting: Internal Medicine

## 2023-11-28 VITALS — BP 124/76 | HR 86 | Ht 62.5 in | Wt 174.0 lb

## 2023-11-28 DIAGNOSIS — F411 Generalized anxiety disorder: Secondary | ICD-10-CM | POA: Insufficient documentation

## 2023-11-28 DIAGNOSIS — F430 Acute stress reaction: Secondary | ICD-10-CM

## 2023-11-28 MED ORDER — SERTRALINE HCL 50 MG PO TABS: 50.0000 mg | ORAL_TABLET | Freq: Every day | ORAL | 1 refills | Status: DC
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
## 2023-11-28 NOTE — Patient Instructions (Addendum)
 Please start the  Sertraline  (generic for Zoloft ) at 1/2 tablet daily in the morning with breakfast for the first week to avoid nausea.  You can increase to a full tablet (with breakfast ) after 1 week if you have  not developed side effects of nausea.    You should start to feel the full effect after two weeks on the full dose   Return to clinic in 4 weeks   We will be praying for [name], [name], [name] and [name]

## 2023-12-06 ENCOUNTER — Encounter: Payer: Self-pay | Admitting: Internal Medicine

## 2023-12-31 ENCOUNTER — Ambulatory Visit: Payer: 59 | Admitting: Internal Medicine

## 2024-01-01 ENCOUNTER — Ambulatory Visit: Payer: 59 | Admitting: Internal Medicine

## 2024-01-07 ENCOUNTER — Ambulatory Visit (INDEPENDENT_AMBULATORY_CARE_PROVIDER_SITE_OTHER): Payer: 59 | Admitting: Internal Medicine

## 2024-01-07 ENCOUNTER — Encounter: Payer: Self-pay | Admitting: Internal Medicine

## 2024-01-07 VITALS — BP 120/82 | HR 73 | Ht 62.5 in | Wt 179.8 lb

## 2024-01-07 DIAGNOSIS — F411 Generalized anxiety disorder: Secondary | ICD-10-CM | POA: Diagnosis not present

## 2024-01-07 DIAGNOSIS — F430 Acute stress reaction: Secondary | ICD-10-CM

## 2024-01-07 DIAGNOSIS — Z1231 Encounter for screening mammogram for malignant neoplasm of breast: Secondary | ICD-10-CM

## 2024-01-07 MED ORDER — TIZANIDINE HCL 4 MG PO TABS: 4.0000 mg | ORAL_TABLET | Freq: Four times a day (QID) | ORAL | 0 refills | Status: DC | PRN

## 2024-01-07 MED ORDER — SERTRALINE HCL 25 MG PO TABS
25.0000 mg | ORAL_TABLET | Freq: Every day | ORAL | 1 refills | Status: DC
Start: 2024-01-07 — End: 2024-04-15

## 2024-01-07 NOTE — Patient Instructions (Addendum)
 YOUR MAMMOGRAM IS DUE, PLEASE CALL AND GET THIS SCHEDULED! [facility] - call [phone]    I have sent in the 25 mg dose of sertraline .     For flares of back pain,  try taking 2 aleve and 2 tylenol every 12 hours  If you do this is for  more than 1-2 doses,  protect stomach with daily prilosec 20 mg      The home health agency should make contact with you by the end of the week

## 2024-01-07 NOTE — Progress Notes (Signed)
 Subjective:  Patient ID: Marie Howard, female    DOB: 10-31-1961  Age: 63 y.o. MRN: [id number]  CC: The primary encounter diagnosis was Encounter for screening mammogram for malignant neoplasm of breast. A diagnosis of Anxiety as acute reaction to exceptional stress was also pertinent to this visit.   HPI Marie Howard presents for  Chief Complaint  Patient presents with   Medical Management of Chronic Issues    1 month follow up on anxiety    Follow up on GAD/depression triggered by family events and son's impending divorce.  She has been taking 25 mg sertraline daily for the past year and notes that her anxiety level is improving,  , she is having increased sleep hours and improved appetite since the nausea has improved . Sleeping better/longer.  Husband and friends have noticed an improvement in her mood.   Not exercising yet. She did develop a symptom listed as a side effect and described as a tingly sensation to  the tops of her thighs. Tingling feeling is more noticeable when she is sitting down .  Not particularly bothersome.  Back pain : managed with ibuprofen   Outpatient Medications Prior to Visit  Medication Sig Dispense Refill   sertraline (ZOLOFT) 50 MG tablet Take 1 tablet (50 mg total) by mouth daily. With breakfast 90 tablet 1   No facility-administered medications prior to visit.    Review of Systems;  Patient denies headache, fevers, malaise, unintentional weight loss, skin rash, eye pain, sinus congestion and sinus pain, sore throat, dysphagia,  hemoptysis , cough, dyspnea, wheezing, chest pain, palpitations, orthopnea, edema, abdominal pain, nausea, melena, diarrhea, constipation, flank pain, dysuria, hematuria, urinary  Frequency, nocturia, numbness, tingling, seizures,  Focal weakness, Loss of consciousness,  Tremor, and suicidal ideation.      Objective:  BP 120/82   Pulse 73   Ht 5' 2.5" (1.588 m)   Wt 179 lb 12.8 oz (81.6 kg)   SpO2 97%   BMI 32.36  kg/m   BP Readings from Last 3 Encounters:  01/07/24 120/82  11/28/23 124/76  04/24/23 130/80    Wt Readings from Last 3 Encounters:  01/07/24 179 lb 12.8 oz (81.6 kg)  11/28/23 174 lb (78.9 kg)  04/24/23 182 lb (82.6 kg)    Physical Exam Vitals reviewed.  Constitutional:      General: She is not in acute distress.    Appearance: Normal appearance. She is normal weight. She is not ill-appearing, toxic-appearing or diaphoretic.  HENT:     Head: Normocephalic.  Eyes:     General: No scleral icterus.       Right eye: No discharge.        Left eye: No discharge.     Conjunctiva/sclera: Conjunctivae normal.  Cardiovascular:     Rate and Rhythm: Normal rate and regular rhythm.     Heart sounds: Normal heart sounds.  Pulmonary:     Effort: Pulmonary effort is normal. No respiratory distress.     Breath sounds: Normal breath sounds.  Musculoskeletal:        General: Normal range of motion.  Skin:    General: Skin is warm and dry.  Neurological:     General: No focal deficit present.     Mental Status: She is alert and oriented to person, place, and time. Mental status is at baseline.  Psychiatric:        Mood and Affect: Mood normal.        Behavior:  Behavior normal.        Thought Content: Thought content normal.        Judgment: Judgment normal.      Lab Results  Component Value Date   HGBA1C 5.8 04/19/2023   HGBA1C 5.7 04/17/2022   HGBA1C 5.5 04/04/2020    Lab Results  Component Value Date   CREATININE 0.89 04/19/2023   CREATININE 0.85 04/17/2022   CREATININE 0.92 04/04/2020    Lab Results  Component Value Date   WBC 5.7 04/19/2023   HGB 14.0 04/19/2023   HCT 42.6 04/19/2023   PLT 256.0 04/19/2023   GLUCOSE 90 04/19/2023   CHOL 230 (H) 04/19/2023   TRIG 62.0 04/19/2023   HDL 72.20 04/19/2023   LDLDIRECT 145.0 04/19/2023   LDLCALC 145 (H) 04/19/2023   ALT 25 04/19/2023   AST 24 04/19/2023   NA 138 04/19/2023   K 4.1 04/19/2023   CL 105  04/19/2023   CREATININE 0.89 04/19/2023   BUN 17 04/19/2023   CO2 28 04/19/2023   TSH 2.73 04/19/2023   HGBA1C 5.8 04/19/2023   MICROALBUR <0.7 04/17/2022    MM 3D SCREEN BREAST BILATERAL Result Date: 07/11/2022 CLINICAL DATA:  Screening. EXAM: DIGITAL SCREENING BILATERAL MAMMOGRAM WITH TOMOSYNTHESIS AND CAD TECHNIQUE: Bilateral screening digital craniocaudal and mediolateral oblique mammograms were obtained. Bilateral screening digital breast tomosynthesis was performed. The images were evaluated with computer-aided detection. COMPARISON:  Previous exam(s). ACR Breast Density Category b: There are scattered areas of fibroglandular density. FINDINGS: There are no findings suspicious for malignancy. IMPRESSION: No mammographic evidence of malignancy. A result letter of this screening mammogram will be mailed directly to the patient. RECOMMENDATION: Screening mammogram in one year. (Code:SM-B-01Y) BI-RADS CATEGORY  1: Negative. Electronically Signed   By: Bary Richard M.D.   On: 07/11/2022 10:42    Assessment & Plan:  .Encounter for screening mammogram for malignant neoplasm of breast -     3D Screening Mammogram, Left and Right; Future  Anxiety as acute reaction to exceptional stress Assessment & Plan: Secondary to family conflicts that appear to be largely out of her control and irreconcileable.  Improved with use of sertraline at 25 mg daily   no changes today.    Other orders -     tiZANidine HCl; Take 1 tablet (4 mg total) by mouth every 6 (six) hours as needed for muscle spasms.  Dispense: 30 tablet; Refill: 0 -     Sertraline HCl; Take 1 tablet (25 mg total) by mouth daily.  Dispense: 90 tablet; Refill: 1     I spent 34 minutes on the day of this face to face encounter counseling patient on stress management,  reviewing the assessment and plan with patient, and post visit ordering and reviewing of  diagnostics and therapeutics with patient  .   Follow-up: No follow-ups on  file.   Sherlene Shams, MD

## 2024-01-07 NOTE — Assessment & Plan Note (Signed)
 Secondary to family conflicts that appear to be largely out of her control and irreconcileable.  Improved with use of sertraline at 25 mg daily   no changes today.

## 2024-01-15 ENCOUNTER — Encounter: Payer: Self-pay | Admitting: Internal Medicine

## 2024-04-14 ENCOUNTER — Encounter: Payer: Self-pay | Admitting: Internal Medicine

## 2024-04-14 DIAGNOSIS — E559 Vitamin D deficiency, unspecified: Secondary | ICD-10-CM

## 2024-04-14 DIAGNOSIS — Z8639 Personal history of other endocrine, nutritional and metabolic disease: Secondary | ICD-10-CM

## 2024-04-14 DIAGNOSIS — E785 Hyperlipidemia, unspecified: Secondary | ICD-10-CM

## 2024-04-14 DIAGNOSIS — R5383 Other fatigue: Secondary | ICD-10-CM

## 2024-04-14 DIAGNOSIS — R7301 Impaired fasting glucose: Secondary | ICD-10-CM

## 2024-04-15 MED ORDER — SERTRALINE HCL 50 MG PO TABS: 50.0000 mg | ORAL_TABLET | Freq: Every day | ORAL | 1 refills | Status: AC

## 2024-04-15 NOTE — Telephone Encounter (Signed)
 I have pended labs for your approval. If correct I will call pt and schedule a lab appt.

## 2024-04-22 ENCOUNTER — Ambulatory Visit
Admission: RE | Admit: 2024-04-22 | Discharge: 2024-04-22 | Disposition: A | Discharge status: Home / Self Care | Source: Ambulatory Visit | Attending: Internal Medicine | Admitting: Internal Medicine

## 2024-04-22 ENCOUNTER — Other Ambulatory Visit (INDEPENDENT_AMBULATORY_CARE_PROVIDER_SITE_OTHER)

## 2024-04-22 DIAGNOSIS — R5383 Other fatigue: Secondary | ICD-10-CM | POA: Diagnosis not present

## 2024-04-22 DIAGNOSIS — Z8639 Personal history of other endocrine, nutritional and metabolic disease: Secondary | ICD-10-CM

## 2024-04-22 DIAGNOSIS — Z1231 Encounter for screening mammogram for malignant neoplasm of breast: Secondary | ICD-10-CM | POA: Diagnosis not present

## 2024-04-22 DIAGNOSIS — R7301 Impaired fasting glucose: Secondary | ICD-10-CM

## 2024-04-22 DIAGNOSIS — E785 Hyperlipidemia, unspecified: Secondary | ICD-10-CM | POA: Diagnosis not present

## 2024-04-22 DIAGNOSIS — E559 Vitamin D deficiency, unspecified: Secondary | ICD-10-CM | POA: Diagnosis not present

## 2024-04-22 LAB — CBC WITH DIFFERENTIAL/PLATELET
Basophils Absolute: 0 10*3/uL (ref 0.0–0.1)
Basophils Relative: 0.7 % (ref 0.0–3.0)
Eosinophils Absolute: 0.1 10*3/uL (ref 0.0–0.7)
Eosinophils Relative: 1.8 % (ref 0.0–5.0)
HCT: 42.8 % (ref 36.0–46.0)
Hemoglobin: 14.2 g/dL (ref 12.0–15.0)
Lymphocytes Relative: 27.4 % (ref 12.0–46.0)
Lymphs Abs: 1.7 10*3/uL (ref 0.7–4.0)
MCHC: 33.2 g/dL (ref 30.0–36.0)
MCV: 91.6 fl (ref 78.0–100.0)
Monocytes Absolute: 0.5 10*3/uL (ref 0.1–1.0)
Monocytes Relative: 8.5 % (ref 3.0–12.0)
Neutro Abs: 3.7 10*3/uL (ref 1.4–7.7)
Neutrophils Relative %: 61.6 % (ref 43.0–77.0)
Platelets: 276 10*3/uL (ref 150.0–400.0)
RBC: 4.67 Mil/uL (ref 3.87–5.11)
RDW: 13.1 % (ref 11.5–15.5)
WBC: 6.1 10*3/uL (ref 4.0–10.5)

## 2024-04-22 LAB — COMPREHENSIVE METABOLIC PANEL WITH GFR
ALT: 23 U/L (ref 0–35)
AST: 22 U/L (ref 0–37)
Albumin: 4.6 g/dL (ref 3.5–5.2)
Alkaline Phosphatase: 71 U/L (ref 39–117)
BUN: 11 mg/dL (ref 6–23)
CO2: 29 meq/L (ref 19–32)
Calcium: 9.8 mg/dL (ref 8.4–10.5)
Chloride: 104 meq/L (ref 96–112)
Creatinine, Ser: 0.82 mg/dL (ref 0.40–1.20)
GFR: 76.21 mL/min (ref >60.00)
Glucose, Bld: 93 mg/dL (ref 70–99)
Potassium: 4.1 meq/L (ref 3.5–5.1)
Sodium: 139 meq/L (ref 135–145)
Total Bilirubin: 0.5 mg/dL (ref 0.2–1.2)
Total Protein: 7.2 g/dL (ref 6.0–8.3)

## 2024-04-22 LAB — LIPID PANEL
Cholesterol: 228 mg/dL — ABNORMAL HIGH (ref 0–200)
HDL: 66.2 mg/dL (ref >39.00)
LDL Cholesterol: 142 mg/dL — ABNORMAL HIGH (ref 0–99)
NonHDL: 161.56
Total CHOL/HDL Ratio: 3
Triglycerides: 97 mg/dL (ref 0.0–149.0)
VLDL: 19.4 mg/dL (ref 0.0–40.0)

## 2024-04-22 LAB — TSH: TSH: 3.16 u[IU]/mL (ref 0.35–5.50)

## 2024-04-22 LAB — VITAMIN D 25 HYDROXY (VIT D DEFICIENCY, FRACTURES): VITD: 20.35 ng/mL — ABNORMAL LOW (ref 30.00–100.00)

## 2024-04-22 LAB — HEMOGLOBIN A1C: Hgb A1c MFr Bld: 5.9 % (ref 4.6–6.5)

## 2024-04-22 LAB — LDL CHOLESTEROL, DIRECT: Direct LDL: 143 mg/dL

## 2024-04-24 ENCOUNTER — Ambulatory Visit: Payer: Self-pay | Admitting: Internal Medicine

## 2024-04-29 ENCOUNTER — Encounter: Payer: Self-pay | Admitting: Internal Medicine

## 2024-04-29 ENCOUNTER — Ambulatory Visit (INDEPENDENT_AMBULATORY_CARE_PROVIDER_SITE_OTHER): Payer: 59 | Admitting: Internal Medicine

## 2024-04-29 VITALS — BP 136/76 | HR 59 | Ht 62.5 in | Wt 183.6 lb

## 2024-04-29 DIAGNOSIS — F411 Generalized anxiety disorder: Secondary | ICD-10-CM

## 2024-04-29 DIAGNOSIS — R030 Elevated blood-pressure reading, without diagnosis of hypertension: Secondary | ICD-10-CM

## 2024-04-29 DIAGNOSIS — F430 Acute stress reaction: Secondary | ICD-10-CM

## 2024-04-29 DIAGNOSIS — R5383 Other fatigue: Secondary | ICD-10-CM

## 2024-04-29 DIAGNOSIS — E785 Hyperlipidemia, unspecified: Secondary | ICD-10-CM

## 2024-04-29 DIAGNOSIS — Z0000 Encounter for general adult medical examination without abnormal findings: Secondary | ICD-10-CM

## 2024-04-29 DIAGNOSIS — K760 Fatty (change of) liver, not elsewhere classified: Secondary | ICD-10-CM

## 2024-04-29 LAB — MICROALBUMIN / CREATININE URINE RATIO
Creatinine,U: 63.9 mg/dL
Microalb Creat Ratio: UNDETERMINED mg/g (ref 0.0–30.0)
Microalb, Ur: <0.7 mg/dL

## 2024-04-29 NOTE — Progress Notes (Signed)
 Patient ID: Marie Howard, female    DOB: 14-Feb-1961  Age: 63 y.o. MRN: [id number]  The patient is here for annual preventive examination and management of other chronic and acute problems.   The risk factors are reflected in the social history.   The roster of all physicians providing medical care to patient - is listed in the Snapshot section of the chart.   Activities of daily living:  The patient is 100% independent in all ADLs: dressing, toileting, feeding as well as independent mobility   Home safety : The patient has smoke detectors in the home. They wear seatbelts.  There are no unsecured firearms at home. There is no violence in the home.    There is no risks for hepatitis, STDs or HIV. There is no   history of blood transfusion. They have no travel history to infectious disease endemic areas of the world.   The patient has seen their dentist in the last six month. They have seen their eye doctor in the last year. The patinet  denies slight hearing difficulty with regard to whispered voices and some television programs.  They have deferred audiologic testing in the last year.  They do not  have excessive sun exposure. Discussed the need for sun protection: hats, long sleeves and use of sunscreen if there is significant sun exposure.    Diet: the importance of a healthy diet is discussed. They do have a healthy diet.   The benefits of regular aerobic exercise were discussed. The patient  exercises  3 to 5 days per week  for  60 minutes.    Depression screen: there are no signs or vegative symptoms of depression- irritability, change in appetite, anhedonia, sadness/tearfullness.   The following portions of the patient's history were reviewed and updated as appropriate: allergies, current medications, past family history, past medical history,  past surgical history, past social history  and problem list.   Visual acuity was not assessed per patient preference since the patient has  regular follow up with an  ophthalmologist. Hearing and body mass index were assessed and reviewed.    During the course of the visit the patient was educated and counseled about appropriate screening and preventive services including : fall prevention , diabetes screening, nutrition counseling, colorectal cancer screening, and recommended immunizations.    Chief Complaint:  1) Depression/anxiety : feels overwhelmed by responsibilities of caring for her parents and her grandchildren,  worrying about Scott's marriage  feels paralyzed by the feeling .    Just wants to eat and sleep.  taking a minimal dose of sertraline  . Not walking or exercise   Review of Symptoms  Patient denies headache, fevers, malaise, unintentional weight loss, skin rash, eye pain, sinus congestion and sinus pain, sore throat, dysphagia,  hemoptysis , cough, dyspnea, wheezing, chest pain, palpitations, orthopnea, edema, abdominal pain, nausea, melena, diarrhea, constipation, flank pain, dysuria, hematuria, urinary  Frequency, nocturia, numbness, tingling, seizures,  Focal weakness, Loss of consciousness,  Tremor, insomnia, depression, anxiety, and suicidal ideation.    Physical Exam:  BP 136/76   Pulse (!) 59   Ht 5' 2.5 (1.588 m)   Wt 183 lb 9.6 oz (83.3 kg)   SpO2 98%   BMI 33.05 kg/m    Physical Exam Vitals reviewed.  Constitutional:      General: She is not in acute distress.    Appearance: Normal appearance. She is normal weight. She is not ill-appearing, toxic-appearing or diaphoretic.  HENT:  Head: Normocephalic.   Eyes:     General: No scleral icterus.       Right eye: No discharge.        Left eye: No discharge.     Conjunctiva/sclera: Conjunctivae normal.    Cardiovascular:     Rate and Rhythm: Normal rate and regular rhythm.     Heart sounds: Normal heart sounds.  Pulmonary:     Effort: Pulmonary effort is normal. No respiratory distress.     Breath sounds: Normal breath sounds.    Musculoskeletal:        General: Normal range of motion.   Skin:    General: Skin is warm and dry.   Neurological:     General: No focal deficit present.     Mental Status: She is alert and oriented to person, place, and time. Mental status is at baseline.   Psychiatric:        Mood and Affect: Mood normal.        Behavior: Behavior normal.        Thought Content: Thought content normal.        Judgment: Judgment normal.    Assessment and Plan: Elevated blood pressure reading without diagnosis of hypertension Assessment & Plan: See has no prior history of hypertension. she has been asked to check BP at home and readings have been  < 130/80  Orders: -     Microalbumin / creatinine urine ratio  Anxiety as acute reaction to exceptional stress Assessment & Plan: Secondary to family conflicts that appear to be largely out of her control and irreconcileable.  Improved but still problematic with use of sertraline  at 25 mg daily  .  Encouraged to increase dose to 50 mg  today.    Caregiver with fatigue Assessment & Plan: Encouraged her to delegate care to othersin family and look for community resources to help manage the care of her parents.   Encounter for preventive health examination Assessment & Plan: age appropriate education and counseling updated, referrals for preventative services and immunizations addressed, dietary and smoking counseling addressed, most recent labs reviewed.  I have personally reviewed and have noted:   1) the patient's medical and social history 2) The pt's use of alcohol, tobacco, and illicit drugs 3) The patient's current medications and supplements 4) Functional ability including ADL's, fall risk, home safety risk, hearing and visual impairment 5) Diet and physical activities 6) Evidence for depression or mood disorder 7) The patient's height, weight, and BMI have been recorded in the chart   Hepatic steatosis Assessment & Plan: ALT  Is  no longer elevated  Lab Results  Component Value Date   ALT 23 04/22/2024   AST 22 04/22/2024   ALKPHOS 71 04/22/2024   BILITOT 0.5 04/22/2024      Hyperlipidemia with target LDL less than 160 Assessment & Plan: 10 yr risk of CAD using the AHA cardiac RC is <4%   Lab Results  Component Value Date   CHOL 228 (H) 04/22/2024   HDL 66.20 04/22/2024   LDLCALC 142 (H) 04/22/2024   LDLDIRECT 143.0 04/22/2024   TRIG 97.0 04/22/2024   CHOLHDL 3 04/22/2024        Return in about 3 months (around 07/30/2024) for depression.  Thersia Flax, MD

## 2024-04-29 NOTE — Assessment & Plan Note (Addendum)
 See has no prior history of hypertension. she has been asked to check BP at home and readings have been  < 130/80

## 2024-04-29 NOTE — Patient Instructions (Addendum)
 You need to hire outside hep for your parents   through an agency.      I have heard really good things bout [facility] (look online)    Increase D 3 to 3000 Ius DAILY   Check BP once daily at variable times and send me readings in one week   Lab Results  Component Value Date   LABMICR Comment 04/04/2020   LABMICR Comment 09/24/2017   MICROALBUR <0.7 04/17/2022

## 2024-04-30 NOTE — Assessment & Plan Note (Signed)

## 2024-04-30 NOTE — Assessment & Plan Note (Signed)
 Secondary to family conflicts that appear to be largely out of her control and irreconcileable.  Improved but still problematic with use of sertraline  at 25 mg daily  .  Encouraged to increase dose to 50 mg  today.

## 2024-04-30 NOTE — Assessment & Plan Note (Signed)
 Encouraged her to delegate care to othersin family and look for community resources to help manage the care of her parents.

## 2024-04-30 NOTE — Assessment & Plan Note (Addendum)
 10 yr risk of CAD using the AHA cardiac RC is <4%   Lab Results  Component Value Date   CHOL 228 (H) 04/22/2024   HDL 66.20 04/22/2024   LDLCALC 142 (H) 04/22/2024   LDLDIRECT 143.0 04/22/2024   TRIG 97.0 04/22/2024   CHOLHDL 3 04/22/2024

## 2024-04-30 NOTE — Assessment & Plan Note (Signed)
 ALT  Is no longer elevated  Lab Results  Component Value Date   ALT 23 04/22/2024   AST 22 04/22/2024   ALKPHOS 71 04/22/2024   BILITOT 0.5 04/22/2024

## 2024-05-02 ENCOUNTER — Ambulatory Visit: Payer: Self-pay | Admitting: Internal Medicine

## 2024-05-05 DIAGNOSIS — H524 Presbyopia: Secondary | ICD-10-CM | POA: Diagnosis not present

## 2024-07-31 ENCOUNTER — Encounter: Payer: Self-pay | Admitting: Internal Medicine

## 2024-07-31 ENCOUNTER — Ambulatory Visit: Admitting: Internal Medicine

## 2024-07-31 VITALS — BP 130/86 | HR 72 | Ht 62.5 in | Wt 182.2 lb

## 2024-07-31 DIAGNOSIS — J0100 Acute maxillary sinusitis, unspecified: Secondary | ICD-10-CM

## 2024-07-31 DIAGNOSIS — F430 Acute stress reaction: Secondary | ICD-10-CM

## 2024-07-31 DIAGNOSIS — F411 Generalized anxiety disorder: Secondary | ICD-10-CM | POA: Diagnosis not present

## 2024-07-31 DIAGNOSIS — F32A Depression, unspecified: Secondary | ICD-10-CM | POA: Diagnosis not present

## 2024-07-31 MED ORDER — BUPROPION HCL ER (XL) 150 MG PO TB24: 150.0000 mg | ORAL_TABLET | Freq: Every day | ORAL | 2 refills | Status: AC

## 2024-07-31 MED ORDER — AMOXICILLIN-POT CLAVULANATE 875-125 MG PO TABS: 1.0000 | ORAL_TABLET | Freq: Two times a day (BID) | ORAL | 0 refills | Status: DC

## 2024-07-31 NOTE — Patient Instructions (Signed)
 For your sinus infection:  Take Augmentin  twice daily with food  for 7 days  and ADD  generic afrin nasal spray  for the congestion .  Do not use for more than 5 days  in a row . use every 12 hours.   Continue  regular tylenol  and nasal rinses     Please take a probiotic ( Align, Floraque or Culturelle),  or eat yogurt  for a minimum of 3 weeks to prevent a serious antibiotic associated diarrhea  Called clostridium dificile colitis.  Taking a probiotic may also prevent vaginitis due to yeast infections and can be continued indefinitely if you feel that it improves your digestion or your elimination (bowels).    Continue 25 mg sertraline  for now.  Adding lowest dose of once daily Wellbutrin  for the depressive symptoms.  Take it in the MORNING.  OK TO COMBINE WITH SERTRALINE 

## 2024-07-31 NOTE — Progress Notes (Unsigned)
 Subjective:  Patient ID: Marie Howard, female    DOB: 11/26/1960  Age: 63 y.o. MRN: [id number]  CC: There were no encounter diagnoses.   HPI Marie Howard presents for  Chief Complaint  Patient presents with   Medical Management of Chronic Issues    3 month follow up on depression    1) Depression :  did not tolerate increase in sertraline  to 50 mg due to acting scatterbrained.  Back down to 25 mg .  no motivation,     2) sore throat and cough since August 23.  Cough still present and left maxillary and frontal sinus pressure,  yesterday the pain spread to ethmoid and right frontal.  Taking mucinex, saline rinse,  tylenol  cold and sinus cough sounds productive.  No  3)  had a sudden pain between shoulder blades during sleep  Outpatient Medications Prior to Visit  Medication Sig Dispense Refill   sertraline  (ZOLOFT ) 50 MG tablet Take 1 tablet (50 mg total) by mouth daily. 90 tablet 1   No facility-administered medications prior to visit.    Review of Systems;  Patient denies headache, fevers, malaise, unintentional weight loss, skin rash, eye pain, sinus congestion and sinus pain, sore throat, dysphagia,  hemoptysis , cough, dyspnea, wheezing, chest pain, palpitations, orthopnea, edema, abdominal pain, nausea, melena, diarrhea, constipation, flank pain, dysuria, hematuria, urinary  Frequency, nocturia, numbness, tingling, seizures,  Focal weakness, Loss of consciousness,  Tremor, insomnia, depression, anxiety, and suicidal ideation.      Objective:  There were no vitals taken for this visit.  BP Readings from Last 3 Encounters:  04/29/24 136/76  01/07/24 120/82  11/28/23 124/76    Wt Readings from Last 3 Encounters:  04/29/24 183 lb 9.6 oz (83.3 kg)  01/07/24 179 lb 12.8 oz (81.6 kg)  11/28/23 174 lb (78.9 kg)    Physical Exam  Lab Results  Component Value Date   HGBA1C 5.9 04/22/2024   HGBA1C 5.8 04/19/2023   HGBA1C 5.7 04/17/2022    Lab Results   Component Value Date   CREATININE 0.82 04/22/2024   CREATININE 0.89 04/19/2023   CREATININE 0.85 04/17/2022    Lab Results  Component Value Date   WBC 6.1 04/22/2024   HGB 14.2 04/22/2024   HCT 42.8 04/22/2024   PLT 276.0 04/22/2024   GLUCOSE 93 04/22/2024   CHOL 228 (H) 04/22/2024   TRIG 97.0 04/22/2024   HDL 66.20 04/22/2024   LDLDIRECT 143.0 04/22/2024   LDLCALC 142 (H) 04/22/2024   ALT 23 04/22/2024   AST 22 04/22/2024   NA 139 04/22/2024   K 4.1 04/22/2024   CL 104 04/22/2024   CREATININE 0.82 04/22/2024   BUN 11 04/22/2024   CO2 29 04/22/2024   TSH 3.16 04/22/2024   HGBA1C 5.9 04/22/2024   MICROALBUR <0.7 04/29/2024    MM 3D SCREENING MAMMOGRAM BILATERAL BREAST Result Date: 04/28/2024 CLINICAL DATA:  Screening. EXAM: DIGITAL SCREENING BILATERAL MAMMOGRAM WITH TOMOSYNTHESIS AND CAD TECHNIQUE: Bilateral screening digital craniocaudal and mediolateral oblique mammograms were obtained. Bilateral screening digital breast tomosynthesis was performed. The images were evaluated with computer-aided detection. COMPARISON:  Previous exam(s). ACR Breast Density Category b: There are scattered areas of fibroglandular density. FINDINGS: There are no findings suspicious for malignancy. IMPRESSION: No mammographic evidence of malignancy. A result letter of this screening mammogram will be mailed directly to the patient. RECOMMENDATION: Screening mammogram in one year. (Code:SM-B-01Y) BI-RADS CATEGORY  1: Negative. Electronically Signed   By: Reyes Phi  M.D.   On: 04/28/2024 14:00    Assessment & Plan:  .There are no diagnoses linked to this encounter.   I spent 34 minutes on the day of this face to face encounter reviewing patient's  most recent visit with cardiology,  nephrology,  and neurology,  prior relevant surgical and non surgical procedures, recent  labs and imaging studies, counseling on weight management,  reviewing the assessment and plan with patient, and post visit  ordering and reviewing of  diagnostics and therapeutics with patient  .   Follow-up: No follow-ups on file.   Verneita LITTIE Kettering, MD

## 2024-08-02 DIAGNOSIS — J0100 Acute maxillary sinusitis, unspecified: Secondary | ICD-10-CM | POA: Insufficient documentation

## 2024-08-02 DIAGNOSIS — F32A Depression, unspecified: Secondary | ICD-10-CM | POA: Insufficient documentation

## 2024-08-02 NOTE — Assessment & Plan Note (Signed)
 Anxiety symptoms are controlled on minimal dose of sertraline  (25) with higher dose causing disordered thinkg.

## 2024-08-02 NOTE — Assessment & Plan Note (Signed)
 She has had sinus drainage and congestion with cough productive of purulent sputum and headaches for the last 5 days.  Patient  has used multiple OTD decongestants,  Sinus rinses,  advil and tylenol along with otc cough suppressants without improvement.  Adding augmentin 

## 2024-08-02 NOTE — Assessment & Plan Note (Signed)
 She cites anhedonoia, lack of concentration ,  and lack of motivation as primary symptoms   Adding welbutrin

## 2024-08-06 ENCOUNTER — Ambulatory Visit: Payer: Self-pay

## 2024-08-06 NOTE — Telephone Encounter (Signed)
 FYI Only or Action Required?: Action required by provider: clinical question for provider.  Patient was last seen in primary care on 07/31/2024 by Marylynn Verneita CROME, MD.  Called Nurse Triage reporting Allergic Reaction.  Symptoms began yesterday.  Interventions attempted: OTC medications: Took Benadryl once yesterday.  Symptoms are: almost resolved.  Triage Disposition: See Physician Within 24 Hours  Patient/caregiver understands and will follow disposition?: Yes       Copied from CRM [id number]. Topic: Clinical - Red Word Triage >> Aug 06, 2024 10:23 AM Maisie BROCKS wrote: Red Word that prompted transfer to Nurse Triage: allergic reaction, possibly to augmentin , lip swollen         Reason for Disposition  Swelling is painful to touch    Mild swelling from allergic reaction, stopped taking antibiotic, symptoms improved but not resolved, no shortness of breath or difficulty swallowing  Answer Assessment - Initial Assessment Questions Took Benadryl and swelling is almost resolved. Patient has stopped the medication but would like to speak with someone in the office about her medications, stating she is now concerned about taking any medication. She would like a call today about that if possible. Please advise.       1. ONSET: When did the swelling start? (e.g., minutes, hours, days)     Last night  2. SEVERITY: How swollen is it? (e.g., part of an upper or lower lip, both lips)     Half of her lip, improved from yesterday  3. ITCHING: Is there any itching? If Yes, ask: How much?   (Scale 1-10; mild, moderate or severe)      Itching around torso that was present prior to taking Augmentin   4. PAIN: Is the swelling painful to touch? If Yes, ask: How painful is it?   (Scale 1-10; mild, moderate or severe)     No 5. CAUSE: What do you think is causing the lip swelling? (e.g., certain food or medicine, recent cosmetic treatment)     Taking Augmentin   6. RECURRENT  SYMPTOM: Have you had lip swelling before? If Yes, ask: When was the last time? What happened that time?     Yes, similar reaction to Macrobid in the past  7. OTHER SYMPTOMS: Do you have any other symptoms? (e.g., fever, toothache)     Cough  Protocols used: Lip Swelling-A-AH

## 2024-08-06 NOTE — Telephone Encounter (Signed)
 Spoke with patient to make her aware of Rollene Arnett's recommendations. Patient states her lip is getting better. Patient states she is scheduled to see Leron Glance, NP tomorrow. Patient was advised to keep that scheduled appointment, as she has been having issue after issue with different medications. Patient verbalized understanding.

## 2024-08-06 NOTE — Telephone Encounter (Unsigned)
 Copied from CRM [id number]. Topic: Clinical - Medical Advice >> Aug 06, 2024 12:14 PM Taleah C wrote: Reason for CRM: pt was triaged earlier and spoke with [facility].   She called back in for re-assurance that he sent a message to clinic staff. I informed her that he did and a nurse has already addressed it and is waiting for DOD review. She wants to make sure that she will be contacted by today. Please advise.

## 2024-08-07 ENCOUNTER — Ambulatory Visit (INDEPENDENT_AMBULATORY_CARE_PROVIDER_SITE_OTHER): Admitting: Nurse Practitioner

## 2024-08-07 VITALS — BP 130/75 | HR 64 | Temp 97.9°F | Ht 62.5 in | Wt 183.0 lb

## 2024-08-07 DIAGNOSIS — F430 Acute stress reaction: Secondary | ICD-10-CM

## 2024-08-07 DIAGNOSIS — Z881 Allergy status to other antibiotic agents status: Secondary | ICD-10-CM | POA: Diagnosis not present

## 2024-08-07 DIAGNOSIS — R051 Acute cough: Secondary | ICD-10-CM

## 2024-08-07 DIAGNOSIS — F411 Generalized anxiety disorder: Secondary | ICD-10-CM

## 2024-08-07 MED ORDER — METHYLPREDNISOLONE 4 MG PO TBPK: ORAL_TABLET | ORAL | 0 refills | Status: DC

## 2024-08-07 MED ORDER — BENZONATATE 200 MG PO CAPS: 200.0000 mg | ORAL_CAPSULE | Freq: Three times a day (TID) | ORAL | 0 refills | Status: DC | PRN

## 2024-08-07 NOTE — Progress Notes (Signed)
 Leron Glance, NP-C Phone: [phone]  Marie Howard is a 63 y.o. female who presents today for lip swelling.   Discussed the use of AI scribe software for clinical note transcription with the patient, who gave verbal consent to proceed.  History of Present Illness   Marie Howard is a 63 year old female who presents with a persistent cough and suspected allergic reaction to Augmentin .  She has been experiencing a persistent cough for several days, severe enough to induce vomiting, as occurred on Wednesday morning. The cough is intense and exhausting, worsening with talking but not disturbing her sleep. She has been producing phlegm, though this has decreased recently. No slurred speech or facial drooping has been noted. She mentions experiencing pain between her shoulder blades, which she experiences after intense coughing.  She started taking Augmentin  on a Friday and experienced lip swelling the following Wednesday, which she associates with an allergic reaction. She has a history of a similar reaction to Macrobid, where her lips swelled after multiple doses. She took Benadryl to manage the swelling, which persisted into the next morning. She is unsure if she has taken penicillin before and is concerned about a potential allergy to it. During the allergic reaction, her blood pressure was elevated.  She reports experiencing itching around her middle, which began before starting Augmentin . Her skin is generally itchy, particularly her hands, which react to washing and cleaning products. She has seen a dermatologist for this issue in the past but is hesitant to use prescribed creams due to concerns about side effects.  She is currently taking sertraline  and has been prescribed Wellbutrin , which she has not yet started. She is experiencing significant stress due to family issues, including her aging parents and her son's separation from his wife, which is affecting her emotional well-being. She  describes herself as anxious and a 'worry wart'.      Social History   Tobacco Use  Smoking Status Never  Smokeless Tobacco Never    Current Outpatient Medications on File Prior to Visit  Medication Sig Dispense Refill   sertraline  (ZOLOFT ) 50 MG tablet Take 1 tablet (50 mg total) by mouth daily. (Patient taking differently: Take 25 mg by mouth daily.) 90 tablet 1   buPROPion  (WELLBUTRIN  XL) 150 MG 24 hr tablet Take 1 tablet (150 mg total) by mouth daily. (Patient not taking: Reported on 08/07/2024) 30 tablet 2   No current facility-administered medications on file prior to visit.     ROS see history of present illness  Objective  Physical Exam Vitals:   08/07/24 1336 08/07/24 1401  BP: (!) 126/90 130/75  Pulse: 64   Temp: 97.9 F (36.6 C)   SpO2: 99%     BP Readings from Last 3 Encounters:  08/07/24 130/75  07/31/24 130/86  04/29/24 136/76   Wt Readings from Last 3 Encounters:  08/07/24 183 lb (83 kg)  07/31/24 182 lb 3.2 oz (82.6 kg)  04/29/24 183 lb 9.6 oz (83.3 kg)    Physical Exam Constitutional:      General: She is not in acute distress.    Appearance: Normal appearance.  HENT:     Head: Normocephalic.     Mouth/Throat:     Comments: No lip swelling present Cardiovascular:     Rate and Rhythm: Normal rate and regular rhythm.     Heart sounds: Normal heart sounds.  Pulmonary:     Effort: Pulmonary effort is normal.     Breath sounds: Normal  breath sounds.  Skin:    General: Skin is warm and dry.  Neurological:     General: No focal deficit present.     Mental Status: She is alert.  Psychiatric:        Mood and Affect: Mood is anxious.        Behavior: Behavior normal.      Assessment/Plan: Please see individual problem list.  Acute cough Assessment & Plan: She experiences a persistent severe cough with vomiting, though sinus symptoms have improved, and additional antibiotics are unnecessary. Steroid options for cough management were  discussed, with a preference for a tapering dose due to side effect concerns. Prescribe a steroid taper pack for cough management and benzonatate  for daytime cough relief. Advise hydration and the use of warm tea with honey and lemon for throat soothing. Recommend lozenges or cough drops for symptomatic relief. Return precautions given to patient.   Orders: -     methylPREDNISolone ; Take as directed.  Dispense: 21 each; Refill: 0 -     Benzonatate ; Take 1 capsule (200 mg total) by mouth 3 (three) times daily as needed for cough.  Dispense: 30 capsule; Refill: 0  Allergic reaction to amoxicillin /clavulanic acid Assessment & Plan: She had an allergic reaction to Augmentin , presenting with lip swelling, suggesting possible penicillin sensitivity. The reaction resolved with Benadryl. Avoid penicillin-based antibiotics in the future.   Anxiety as acute reaction to exceptional stress Assessment & Plan: Her ongoing anxiety and depression are exacerbated by personal stressors. She is on sertraline  and hesitant to start Wellbutrin  due to side effect concerns. Continue sertraline  as prescribed and delay starting Wellbutrin  until after completing the steroid course. Monitor mental health symptoms and consider follow-up with PCP for medication management.      Return if symptoms worsen or fail to improve.   Leron Glance, NP-C [facility]

## 2024-08-18 ENCOUNTER — Encounter: Payer: Self-pay | Admitting: Internal Medicine

## 2024-08-19 ENCOUNTER — Encounter: Payer: Self-pay | Admitting: Internal Medicine

## 2024-08-19 ENCOUNTER — Encounter: Payer: Self-pay | Admitting: Nurse Practitioner

## 2024-08-19 DIAGNOSIS — Z881 Allergy status to other antibiotic agents status: Secondary | ICD-10-CM | POA: Insufficient documentation

## 2024-08-19 DIAGNOSIS — R051 Acute cough: Secondary | ICD-10-CM | POA: Insufficient documentation

## 2024-08-19 NOTE — Assessment & Plan Note (Signed)
 She experiences a persistent severe cough with vomiting, though sinus symptoms have improved, and additional antibiotics are unnecessary. Steroid options for cough management were discussed, with a preference for a tapering dose due to side effect concerns. Prescribe a steroid taper pack for cough management and benzonatate  for daytime cough relief. Advise hydration and the use of warm tea with honey and lemon for throat soothing. Recommend lozenges or cough drops for symptomatic relief. Return precautions given to patient.

## 2024-08-19 NOTE — Assessment & Plan Note (Signed)
 She had an allergic reaction to Augmentin , presenting with lip swelling, suggesting possible penicillin sensitivity. The reaction resolved with Benadryl. Avoid penicillin-based antibiotics in the future.

## 2024-08-19 NOTE — Assessment & Plan Note (Signed)
 Her ongoing anxiety and depression are exacerbated by personal stressors. She is on sertraline  and hesitant to start Wellbutrin  due to side effect concerns. Continue sertraline  as prescribed and delay starting Wellbutrin  until after completing the steroid course. Monitor mental health symptoms and consider follow-up with PCP for medication management.

## 2024-08-21 ENCOUNTER — Other Ambulatory Visit: Payer: Self-pay | Admitting: Internal Medicine

## 2024-08-21 DIAGNOSIS — T7840XS Allergy, unspecified, sequela: Secondary | ICD-10-CM

## 2024-08-21 NOTE — Telephone Encounter (Unsigned)
 Copied from CRM #[id number]. Topic: Clinical - Medical Advice >> Aug 21, 2024  1:39 PM Delon T wrote: Reason for CRM: wants to possibly stop taking sertraline  (ZOLOFT ) 50 MG tablet Due to possible allergy, need to know how to taper off- [phone]

## 2024-08-24 ENCOUNTER — Other Ambulatory Visit

## 2024-08-24 DIAGNOSIS — T7840XS Allergy, unspecified, sequela: Secondary | ICD-10-CM

## 2024-08-24 DIAGNOSIS — T7840XA Allergy, unspecified, initial encounter: Secondary | ICD-10-CM

## 2024-08-25 LAB — COMPREHENSIVE METABOLIC PANEL WITH GFR
ALT: 24 U/L (ref 0–35)
AST: 23 U/L (ref 0–37)
Albumin: 4.4 g/dL (ref 3.5–5.2)
Alkaline Phosphatase: 61 U/L (ref 39–117)
BUN: 9 mg/dL (ref 6–23)
CO2: 28 meq/L (ref 19–32)
Calcium: 9.8 mg/dL (ref 8.4–10.5)
Chloride: 104 meq/L (ref 96–112)
Creatinine, Ser: 0.86 mg/dL (ref 0.40–1.20)
GFR: 71.81 mL/min (ref >60.00)
Glucose, Bld: 85 mg/dL (ref 70–99)
Potassium: 4.2 meq/L (ref 3.5–5.1)
Sodium: 140 meq/L (ref 135–145)
Total Bilirubin: 0.5 mg/dL (ref 0.2–1.2)
Total Protein: 6.9 g/dL (ref 6.0–8.3)

## 2024-08-25 LAB — CBC WITH DIFFERENTIAL/PLATELET
Basophils Absolute: 0 K/uL (ref 0.0–0.1)
Basophils Relative: 0.6 % (ref 0.0–3.0)
Eosinophils Absolute: 0.1 K/uL (ref 0.0–0.7)
Eosinophils Relative: 1.7 % (ref 0.0–5.0)
HCT: 41.2 % (ref 36.0–46.0)
Hemoglobin: 13.7 g/dL (ref 12.0–15.0)
Lymphocytes Relative: 26.5 % (ref 12.0–46.0)
Lymphs Abs: 1.8 K/uL (ref 0.7–4.0)
MCHC: 33.2 g/dL (ref 30.0–36.0)
MCV: 92.5 fl (ref 78.0–100.0)
Monocytes Absolute: 0.8 K/uL (ref 0.1–1.0)
Monocytes Relative: 11.7 % (ref 3.0–12.0)
Neutro Abs: 4 K/uL (ref 1.4–7.7)
Neutrophils Relative %: 59.5 % (ref 43.0–77.0)
Platelets: 264 K/uL (ref 150.0–400.0)
RBC: 4.46 Mil/uL (ref 3.87–5.11)
RDW: 12.9 % (ref 11.5–15.5)
WBC: 6.8 K/uL (ref 4.0–10.5)

## 2024-09-01 ENCOUNTER — Encounter: Payer: Self-pay | Admitting: Internal Medicine

## 2024-09-01 ENCOUNTER — Ambulatory Visit (INDEPENDENT_AMBULATORY_CARE_PROVIDER_SITE_OTHER): Admitting: Internal Medicine

## 2024-09-01 VITALS — BP 122/72 | HR 90 | Ht 62.5 in | Wt 182.4 lb

## 2024-09-01 DIAGNOSIS — L509 Urticaria, unspecified: Secondary | ICD-10-CM | POA: Diagnosis not present

## 2024-09-01 MED ORDER — FAMOTIDINE 20 MG PO TABS: 20.0000 mg | ORAL_TABLET | Freq: Two times a day (BID) | ORAL | 2 refills | Status: AC

## 2024-09-01 MED ORDER — MONTELUKAST SODIUM 10 MG PO TABS: 10.0000 mg | ORAL_TABLET | Freq: Every day | ORAL | 3 refills | Status: AC

## 2024-09-01 MED ORDER — EPINEPHRINE 0.3 MG/0.3ML IJ SOAJ: 0.3000 mg | INTRAMUSCULAR | 1 refills | Status: AC | PRN

## 2024-09-01 NOTE — Patient Instructions (Addendum)
 IF YOUR SYMPTOMS RETURN ,  EVEN JUST ITCHING, START THIS REGIMEN  :  ALLEGRA EVERY 6 HOURS FAMOTIDINE EVERY 12 HOURS SINGULAIR ONCE DAILY   DO NOT USE EPI PEN UNLESS TONGUE SWELLS OR YOU DEVELOP TROUBLE BREATHING

## 2024-09-01 NOTE — Progress Notes (Unsigned)
 Subjective:  Patient ID: Marie Howard, female    DOB: 1961-07-14  Age: 63 y.o. MRN: [id number]  CC: There were no encounter diagnoses.   HPI BURNETT LIEBER presents for  Chief Complaint  Patient presents with   Medical Management of Chronic Issues    1 month follow up    63 yr old female seen one month ago for unresolving depression/anxiety  presents after having an allergic reaction in mid September that started on Sept  17  after being prescribed augmentin  on Sept 12   for sinusitis .   the reaction started with with lip swelling and facial swelling. She stopped the augmentin  immediately and took an antihistamine which helped  reduce the swelling and started prednisone  on Sept 19. swelling recurred on September 25,   the last day of her prednisone  taper, itching and hives started that morning   that she ,treated with benadryl one dose. The following day was fine.  Sept 27 she developed itching of hands followed by lip swelling , resumed benadryl,  on Sept 29  her  chin swelled significantly ,  continued to take benadryl daily /tid  developed whelps on arms and torso , then spread to buttocks and thighs on Sept 30  Ate dinner at [employer]  on Sept  30.  Started coughing a lot, then upper lip started swelling again, treated with benadryl , swelling resolved but itching continued  Patient has been taking sertraline  since January,  increased her dose in June.  Stopped sertraline  on October 5-6 . Continues to itch and notes small bumps on legs/buttocks,  but improved.  Switched from benadryl to allegra and stopped the allegra a few days ago.  However, she developed lip swelling 4 days ago after eating ice cream and walnuts  She did not have tongue swelling   Her appointment with allergy is in [place] .     Outpatient Medications Prior to Visit  Medication Sig Dispense Refill   buPROPion  (WELLBUTRIN  XL) 150 MG 24 hr tablet Take 1 tablet (150 mg total) by mouth daily. (Patient not  taking: Reported on 09/01/2024) 30 tablet 2   sertraline  (ZOLOFT ) 50 MG tablet Take 1 tablet (50 mg total) by mouth daily. (Patient not taking: Reported on 09/01/2024) 90 tablet 1   benzonatate  (TESSALON ) 200 MG capsule Take 1 capsule (200 mg total) by mouth 3 (three) times daily as needed for cough. (Patient not taking: Reported on 09/01/2024) 30 capsule 0   methylPREDNISolone  (MEDROL  DOSEPAK) 4 MG TBPK tablet Take as directed. (Patient not taking: Reported on 09/01/2024) 21 each 0   No facility-administered medications prior to visit.    Review of Systems;  Patient denies headache, fevers, malaise, unintentional weight loss, skin rash, eye pain, sinus congestion and sinus pain, sore throat, dysphagia,  hemoptysis , cough, dyspnea, wheezing, chest pain, palpitations, orthopnea, edema, abdominal pain, nausea, melena, diarrhea, constipation, flank pain, dysuria, hematuria, urinary  Frequency, nocturia, numbness, tingling, seizures,  Focal weakness, Loss of consciousness,  Tremor, insomnia, depression, anxiety, and suicidal ideation.      Objective:  BP 122/72   Pulse 90   Ht 5' 2.5 (1.588 m)   Wt 182 lb 6.4 oz (82.7 kg)   SpO2 98%   BMI 32.83 kg/m   BP Readings from Last 3 Encounters:  09/01/24 122/72  08/07/24 130/75  07/31/24 130/86    Wt Readings from Last 3 Encounters:  09/01/24 182 lb 6.4 oz (82.7 kg)  08/07/24 183 lb (  83 kg)  07/31/24 182 lb 3.2 oz (82.6 kg)    Physical Exam  Lab Results  Component Value Date   HGBA1C 5.9 04/22/2024   HGBA1C 5.8 04/19/2023   HGBA1C 5.7 04/17/2022    Lab Results  Component Value Date   CREATININE 0.86 08/24/2024   CREATININE 0.82 04/22/2024   CREATININE 0.89 04/19/2023    Lab Results  Component Value Date   WBC 6.8 08/24/2024   HGB 13.7 08/24/2024   HCT 41.2 08/24/2024   PLT 264.0 08/24/2024   GLUCOSE 85 08/24/2024   CHOL 228 (H) 04/22/2024   TRIG 97.0 04/22/2024   HDL 66.20 04/22/2024   LDLDIRECT 143.0 04/22/2024    LDLCALC 142 (H) 04/22/2024   ALT 24 08/24/2024   AST 23 08/24/2024   NA 140 08/24/2024   K 4.2 08/24/2024   CL 104 08/24/2024   CREATININE 0.86 08/24/2024   BUN 9 08/24/2024   CO2 28 08/24/2024   TSH 3.16 04/22/2024   HGBA1C 5.9 04/22/2024   MICROALBUR <0.7 04/29/2024    MM 3D SCREENING MAMMOGRAM BILATERAL BREAST Result Date: 04/28/2024 CLINICAL DATA:  Screening. EXAM: DIGITAL SCREENING BILATERAL MAMMOGRAM WITH TOMOSYNTHESIS AND CAD TECHNIQUE: Bilateral screening digital craniocaudal and mediolateral oblique mammograms were obtained. Bilateral screening digital breast tomosynthesis was performed. The images were evaluated with computer-aided detection. COMPARISON:  Previous exam(s). ACR Breast Density Category b: There are scattered areas of fibroglandular density. FINDINGS: There are no findings suspicious for malignancy. IMPRESSION: No mammographic evidence of malignancy. A result letter of this screening mammogram will be mailed directly to the patient. RECOMMENDATION: Screening mammogram in one year. (Code:SM-B-01Y) BI-RADS CATEGORY  1: Negative. Electronically Signed   By: Reyes Phi M.D.   On: 04/28/2024 14:00    Assessment & Plan:  .There are no diagnoses linked to this encounter.   I spent 34 minutes on the day of this face to face encounter reviewing patient's  most recent visit with cardiology,  nephrology,  and neurology,  prior relevant surgical and non surgical procedures, recent  labs and imaging studies, counseling on weight management,  reviewing the assessment and plan with patient, and post visit ordering and reviewing of  diagnostics and therapeutics with patient  .   Follow-up: No follow-ups on file.   Verneita LITTIE Kettering, MD

## 2024-09-02 DIAGNOSIS — L509 Urticaria, unspecified: Secondary | ICD-10-CM | POA: Insufficient documentation

## 2024-09-02 NOTE — Assessment & Plan Note (Addendum)
 Unclear etiology:  medication vs food vs environment vs neurotic.  She has no eosinophilia .  Has only been off of sertraline  for a week or less. Discussed preventive treatment with histamine/mast cell activiation blockadel  she prefers to suspend all medications and treat as needed   .   Allergy testing needed

## 2024-10-20 DIAGNOSIS — T7819XD Other adverse food reactions, not elsewhere classified, subsequent encounter: Secondary | ICD-10-CM | POA: Diagnosis not present

## 2024-10-20 DIAGNOSIS — L299 Pruritus, unspecified: Secondary | ICD-10-CM | POA: Diagnosis not present

## 2024-10-20 DIAGNOSIS — R21 Rash and other nonspecific skin eruption: Secondary | ICD-10-CM | POA: Diagnosis not present

## 2024-10-20 DIAGNOSIS — R229 Localized swelling, mass and lump, unspecified: Secondary | ICD-10-CM | POA: Diagnosis not present

## 2024-10-21 ENCOUNTER — Telehealth: Payer: Self-pay

## 2024-10-21 NOTE — Telephone Encounter (Signed)
 Copied from CRM #[id number]. Topic: Clinical - Request for Lab/Test Order >> Oct 21, 2024  2:32 PM Macario HERO wrote: Reason for CRM: Patient stated she went to [facility] center and she is requesting labs to be done at Dr.Tullos office and if lab work can be done by her. Patient did not want to list all lab orders and is requesting a call from clinic.

## 2024-10-26 ENCOUNTER — Encounter: Payer: Self-pay | Admitting: Internal Medicine

## 2024-10-26 NOTE — Telephone Encounter (Signed)
 Spoke with pt and advised her to take a picture of the requisition and upload it to her mychart so we can make sure we can do the requested labs in our office.

## 2024-10-27 NOTE — Telephone Encounter (Signed)
 Is it okay for pt to have these labs done here and then we fax them the [facility]?

## 2024-10-28 NOTE — Telephone Encounter (Signed)
 Patient called in and would like to know is there any update on this lab request? She also said that she experienced some facial  swelling on last night and want to speak with medical assistant about it,she has a few questions.

## 2024-10-28 NOTE — Telephone Encounter (Signed)
 Called and spoke to pt in regards to swelling face.   Started: last night (also states she has been dealing with this off and on since September and sees an Proofreader) it was more significant than it has been in the past  Denies: SOB, CP, H/A, blurred vision, difficulty swallowing, discoloration, or swollen tongue  States her lips, gums, cheek, and left side of her face was swollen last night more significant than it has been (@ time of call it has gotten better) Pt stated she spoke to her allergist about if she can take benadryl and another medication together to help.    Informed pt that a msg was sent to PCP asking if it is okay for her to get those labs she previously asked about done here and that we will update her once she responds.

## 2024-10-30 DIAGNOSIS — R21 Rash and other nonspecific skin eruption: Secondary | ICD-10-CM | POA: Diagnosis not present
